# Patient Record
Sex: Female | Born: 1965 | Race: White | Hispanic: Yes | Marital: Married | Smoking: Never smoker
Health system: Southern US, Community
[De-identification: ages and names within clinical notes are randomized; demographics above are authoritative.]

## PROBLEM LIST (undated history)

## (undated) DIAGNOSIS — D649 Anemia, unspecified: Secondary | ICD-10-CM

## (undated) DIAGNOSIS — Z803 Family history of malignant neoplasm of breast: Secondary | ICD-10-CM

## (undated) DIAGNOSIS — C50919 Malignant neoplasm of unspecified site of unspecified female breast: Secondary | ICD-10-CM

## (undated) DIAGNOSIS — R232 Flushing: Secondary | ICD-10-CM

## (undated) HISTORY — DX: Anemia, unspecified: D64.9

## (undated) HISTORY — PX: OTHER SURGICAL HISTORY: SHX169

## (undated) HISTORY — DX: Flushing: R23.2

## (undated) HISTORY — DX: Family history of malignant neoplasm of breast: Z80.3

## (undated) HISTORY — PX: TUBAL LIGATION: SHX77

## (undated) HISTORY — DX: Malignant neoplasm of unspecified site of unspecified female breast: C50.919

---

## 1997-06-20 ENCOUNTER — Other Ambulatory Visit: Admission: RE | Admit: 1997-06-20 | Discharge: 1997-06-20 | Payer: Self-pay | Admitting: Obstetrics and Gynecology

## 1997-07-21 ENCOUNTER — Other Ambulatory Visit: Admission: RE | Admit: 1997-07-21 | Discharge: 1997-07-21 | Payer: Self-pay | Admitting: Gynecology

## 1997-08-14 ENCOUNTER — Encounter: Admission: RE | Admit: 1997-08-14 | Discharge: 1997-11-12 | Payer: Self-pay | Admitting: Gynecology

## 1997-12-13 ENCOUNTER — Inpatient Hospital Stay (HOSPITAL_COMMUNITY): Admission: AD | Admit: 1997-12-13 | Discharge: 1997-12-16 | Payer: Self-pay | Admitting: *Deleted

## 1998-01-29 ENCOUNTER — Other Ambulatory Visit: Admission: RE | Admit: 1998-01-29 | Discharge: 1998-01-29 | Payer: Self-pay | Admitting: Gynecology

## 1998-07-26 ENCOUNTER — Other Ambulatory Visit: Admission: RE | Admit: 1998-07-26 | Discharge: 1998-07-26 | Payer: Self-pay | Admitting: Gynecology

## 1999-05-30 ENCOUNTER — Encounter (INDEPENDENT_AMBULATORY_CARE_PROVIDER_SITE_OTHER): Payer: Self-pay

## 1999-05-31 ENCOUNTER — Inpatient Hospital Stay (HOSPITAL_COMMUNITY): Admission: AD | Admit: 1999-05-31 | Discharge: 1999-06-03 | Payer: Self-pay | Admitting: Gynecology

## 1999-06-05 ENCOUNTER — Encounter: Admission: RE | Admit: 1999-06-05 | Discharge: 1999-09-03 | Payer: Self-pay | Admitting: Gynecology

## 1999-07-25 ENCOUNTER — Other Ambulatory Visit: Admission: RE | Admit: 1999-07-25 | Discharge: 1999-07-25 | Payer: Self-pay | Admitting: Gynecology

## 2009-07-05 ENCOUNTER — Ambulatory Visit (HOSPITAL_COMMUNITY): Admission: RE | Admit: 2009-07-05 | Discharge: 2009-07-05 | Payer: Self-pay | Admitting: Orthopaedic Surgery

## 2010-05-21 LAB — CBC
HCT: 26.9 % — ABNORMAL LOW (ref 36.0–46.0)
Hemoglobin: 8.5 g/dL — ABNORMAL LOW (ref 12.0–15.0)
MCHC: 31.6 g/dL (ref 30.0–36.0)
MCV: 65.4 fL — ABNORMAL LOW (ref 78.0–100.0)
Platelets: 273 10*3/uL (ref 150–400)
RDW: 18.6 % — ABNORMAL HIGH (ref 11.5–15.5)

## 2010-07-19 NOTE — Op Note (Signed)
[facility]  Patient:    Jennifer Raymond, Jennifer Raymond                      MRN: [id number] Proc. Date: 05/31/99 Adm. Date:  [id number] Attending:  Tonye Royalty                           Operative Report  PREOPERATIVE DIAGNOSES:       1. Term intrauterine pregnancy.                               2. Labor.                               3. Lower uterine segment transverse cesarean section.                               4. Request for elective repeat cesarean section.                               5. Request elective permanent sterilization.  POSTOPERATIVE DIAGNOSES:      1. Term intrauterine pregnancy.                               2. Labor.                               3. Lower uterine segment transverse cesarean section.                               4. Request for elective repeat cesarean section.                               5. Request elective permanent sterilization.  OPERATION:                    1. Elective repeat cesarean section.                               2. Elective bilateral tubal sterilization procedure,                                  Pomeroy technique.  SURGEON:                      Juan H. Lily Peer, M.D.  ANESTHESIA:                   Spinal  INDICATION FOR PROCEDURE:     A 45 year old, Gravida 2, Para 1, 38-2/[redacted] weeks gestation with previous cesarean section requesting elective repeat and bilateral tubal sterilization procedure.  The patient in labor.  Cervix 5 cm dilated. Completely effaced with minus 1 station.  FINDINGS:                     Female infant, Apgars of 9 and  9 with a weight of 7 pounds 11 ounces. Arterial cord pH of 7.26.  Pelvic adhesions.  Anterior uterine fibroid measuring 2 x 2 cm.  Bladder adhered high in the anterior uterine wall.  Normal tubes and ovaries.  DESCRIPTION OF PROCEDURE:     After the patient was adequately counselled, she as taken to the operating room and she underwent a  successful spinal placement.  A  Pfannenstiel skin incision was made adjacent to previous Pfannenstiel scar. The incision was carried down from the skin, subcutaneous tissue down to the rectus  fascia whereby a midline nick was made. The fascia was incised in a transverse fashion.  The midline raphe was entered cautiously.  The peritoneum was entered  cautiously.  The bladder flap was meticulously developed after freeing it from ts adherence high into the anterior wall along with omental adhesions. Once this was freed and brought down, the lower uterine segment was identified and a transverse incision was made. Clear amniotic fluid was present.  The infant was delivered.  The nasopharynx was bulbed suctioned.  The newborn was delivered.  The cord was  doubly clamped and excised.  The newborn gave immediate cry.  The female infant was shown to the parents and passed off to the pediatricians who were in attendance who gave the above mentioned parameters.  After cord blood was obtained, the placenta was delivered from the intrauterine cavity and the uterus was exteriorized. The uterine cavity was swept clean of remaining products of conception.  The lower uterine segment was closed in a locking stitch manner with 0 Vicryl suture. Due to the meticulous dissection and separation of the bladder and omentum from the anterior uterine wall. The serosa appears somewhat denuded and several figure-of-eight sutures with 3-0 Vicryl suture was required for additional hemostasis after the uterus was placed back into the abdominal cavity. Surgicel  strip was placed in the interior uterine wall as well as the Space of Retzius which had some mild oozing.  Prior to that it was contained but for additional hemostasis the Surgicel was placed.  After this, the visceroperitoneum was now reapproximated and the rectus fascia was approximated with a running stitch of 0 Vicryl suture. The subcutaneous  bleeders Bovie cauterized and the skin was reapproximated with  skin clips followed by placement of Xeroform gauze and followed by a dressing. The patient was transferred to recovery room with stable vital signs. Blood loss from procedure was 1000 cc. IV fluid was 4000 cc of lactated Ringers. Urine output was 400 cc.  Of note, en route from the emergency room to the operating room due to the fact that the patient had a history of positive Group B Strep culture and was in labor and 4 to 5 cm dilated, she was given 3 gram of Unasyn IV. DD:  05/31/99 TD:  05/31/99 Job: [id number]

## 2010-07-19 NOTE — H&P (Signed)
[facility]  Patient:    Jennifer Raymond, Jennifer Raymond                      MRN: [id number] Adm. Date:  [id number] Attending:  Tonye Royalty                         History and Physical  CHIEF COMPLAINT:              1. Previous cesarean section; request for elective                                  repeat.                               2. Active labor.  HISTORY:                      The patient is a 45 year old gravida 2, para 1 with a last menstrual period July 5th, estimated date of confinement of June 12, 1999, currently 38-2/[redacted] weeks gestation, who presented to [facility] complaining of contractions starting at approximately 1600 hours yesterday. When she was placed on the monitor, she was found to be contracting every two to three minutes apart and her pelvic examination demonstrated her cervix to be 5-cm dilated, complete effacement and -1 station.  Patient denied any rupture of membranes and there was no gross amniotic fluid noted on the pad or on vaginal examination.  Patient had had a cesarean section for failure to progress in labor with her previous pregnancy and decided to proceed with an elective repeat, despite the fact that she was counseled, and explained to her the ACOG guidelines and that she would be a candidate for trial of labor; nevertheless, she opted to proceed  with a repeat cesarean section.  Also, she had requested for immediate postpartum tubal sterilization procedure.  The risks, benefits, pros and cons of the procedure were discussed prenatally as well.  Patients prenatal course essentially had otherwise been unremarkable.  ALLERGIES:                    Patient denies any allergy.  PAST MEDICAL HISTORY:         Previous pregnancy in 1999 resulted in cesarean section secondary to failure to progress.  She did have gestational diabetes with that pregnancy but had normal glucose screen in this  pregnancy.  No other medical problems reported.  PHYSICAL EXAMINATION:  VITAL SIGNS:                  Temperature 99.2, respirations 18, blood pressure  138/76.  HEENT:                        Unremarkable.  NECK:                         Supple.  Trachea midline.  No carotid bruits.  No  thyromegaly.  LUNGS:                        Clear to auscultation, without rhonchi or wheezes.  HEART:  Regular rate and rhythm.  No murmurs or gallops.  BREASTS:                      Examination done during the first trimester was reported to be normal.  ABDOMEN:                      Gravid uterus, vertex presentation by Thayer Ohm maneuver.  Positive fetal heart tones.  PELVIC:                       Cervix 4- to 5-cm dilated, complete effacement, -1 station.  EXTREMITIES:                  DTRs 1+.  Negative clonus.  Trace edema.  PRENATAL LABORATORY DATA:     O-positive blood type.  Negative antibody screen.  VDRL, hepatitis B surface antigen and HIV were negative.  Rubella titer with evidence of immunity.  Pap smear was normal.  Alpha-fetoprotein was normal. Diabetes screen was normal and group B strep culture was positive.  ASSESSMENT:                   [age]-year-old gravida 2, para 1, with previous cesarean section, in active labor with advanced cervical dilatation and 5-cm dilated, at 38-2/[redacted] weeks gestation, with a previous cesarean section, requesting elective repeat as well as elective permanent sterilization.  The risks, benefits, pros and cons, failure rate, potential complications from repeat cesarean section were discussed, all questions were answered and will follow accordingly.  Overall fetal heart rate tracing was reassuring, occasional variables were noted and vital signs were stable.  PLAN:                         Repeat cesarean section with bilateral tubal sterilization procedure. DD:  05/31/99 TD:  05/31/99 Job: [id number]

## 2010-07-19 NOTE — Discharge Summary (Signed)
[facility]  Patient:    Jennifer Raymond, Jennifer Raymond                      MRN: [id number] Adm. Date:  [id number] Disc. Date: [id number] Attending:  Tonye Royalty Dictator:   Antony Contras, RNC, [facility]                           Discharge Summary  DISCHARGE DIAGNOSES:          1. Intrauterine pregnancy at term.                               2. History of previous cesarean section, desires                                  elective repeat.                               3. Delivery of a viable infant.  PROCEDURE:                    Elective repeat cesarean section, elective bilateral tubal sterilization Pomeroy technique.  HISTORY OF PRESENT ILLNESS:   The patient is a 45 year old, gravida 2, para 1-0-0-1, with an LMP September 05, 1998, [facility] June 12, 1999.  The patient did have gestational diabetes with a previous pregnancy, but glucose screen with this pregnancy was within normal limits.  PRENATAL LABORATORY DATA:     Blood type O positive, antibody screen negative, rubella immune.  RPR, HBSAG, HIV negative.  MSAFP within normal limits.  GBS positive.  HOSPITAL COURSE:              The patient presented on May 31, 1999, in labor. Cervical examination was 5 cm, completely effaced, and -1 station.  The patient  desired repeat cesarean section with this pregnancy and sterilization, so a low  transverse cesarean section with bilateral tubal sterilization was carried out under spinal anesthesia by Gaetano Hawthorne. Lily Peer, M.D.  The patient was delivered of an Apgars 9 and 84 female infant.  Weight was 7 pounds 11 ounces.  Cord pH was 7.26. Pelvic findings included some pelvic adhesions.  Anterior uterine fibroids 2.2 m. Postoperative course was uncomplicated.  The patient remained afebrile and voiding without difficulty.  She was able to be discharged on her third postoperative day in satisfactory condition.  Postoperative CBC; hematocrit 32.1,  hemoglobin 11.2, platelets 94, WBC 13.1.  DISPOSITION:                  Follow up at [facility] in six weeks.  Continue with prenatal vitamins and iron, Motrin and Tylox for pain. DD:  07/08/99 TD:  07/08/99 Job: [id number]

## 2013-10-11 ENCOUNTER — Other Ambulatory Visit (HOSPITAL_COMMUNITY): Payer: Self-pay | Admitting: Physician Assistant

## 2013-10-11 DIAGNOSIS — Z1231 Encounter for screening mammogram for malignant neoplasm of breast: Secondary | ICD-10-CM

## 2013-10-21 ENCOUNTER — Ambulatory Visit (HOSPITAL_COMMUNITY)
Admission: RE | Admit: 2013-10-21 | Discharge: 2013-10-21 | Disposition: A | Discharge status: Home / Self Care | Payer: Self-pay | Source: Ambulatory Visit | Attending: Physician Assistant | Admitting: Physician Assistant

## 2013-10-21 DIAGNOSIS — Z1231 Encounter for screening mammogram for malignant neoplasm of breast: Secondary | ICD-10-CM

## 2013-11-03 ENCOUNTER — Other Ambulatory Visit (HOSPITAL_COMMUNITY): Payer: Self-pay | Admitting: *Deleted

## 2013-11-03 DIAGNOSIS — N6310 Unspecified lump in the right breast, unspecified quadrant: Secondary | ICD-10-CM

## 2013-11-03 DIAGNOSIS — N6452 Nipple discharge: Secondary | ICD-10-CM

## 2013-11-15 ENCOUNTER — Other Ambulatory Visit (HOSPITAL_COMMUNITY): Payer: Self-pay | Admitting: Obstetrics and Gynecology

## 2013-11-15 ENCOUNTER — Ambulatory Visit
Admission: RE | Admit: 2013-11-15 | Discharge: 2013-11-15 | Disposition: A | Discharge status: Home / Self Care | Payer: No Typology Code available for payment source | Source: Ambulatory Visit | Attending: Obstetrics and Gynecology | Admitting: Obstetrics and Gynecology

## 2013-11-15 ENCOUNTER — Ambulatory Visit (HOSPITAL_COMMUNITY)
Admission: RE | Admit: 2013-11-15 | Discharge: 2013-11-15 | Disposition: A | Discharge status: Home / Self Care | Payer: No Typology Code available for payment source | Source: Ambulatory Visit | Attending: Obstetrics and Gynecology | Admitting: Obstetrics and Gynecology

## 2013-11-15 ENCOUNTER — Encounter (HOSPITAL_COMMUNITY): Payer: Self-pay

## 2013-11-15 VITALS — BP 120/78 | Temp 99.6°F | Ht 59.0 in | Wt 185.4 lb

## 2013-11-15 DIAGNOSIS — N6310 Unspecified lump in the right breast, unspecified quadrant: Secondary | ICD-10-CM

## 2013-11-15 DIAGNOSIS — N6452 Nipple discharge: Secondary | ICD-10-CM

## 2013-11-15 DIAGNOSIS — Z1239 Encounter for other screening for malignant neoplasm of breast: Secondary | ICD-10-CM

## 2013-11-15 DIAGNOSIS — N6311 Unspecified lump in the right breast, upper outer quadrant: Secondary | ICD-10-CM | POA: Insufficient documentation

## 2013-11-15 NOTE — Progress Notes (Signed)
Complaints of right breast lump x 5 months. Complaints of right breast greenish colored discharge that happens spontaneously during her shower x 3-4 months.  Pap Smear:   Pap smear not completed today. Last Pap smear was in March 2014 at one of the free cervical cancer screenings offered by [facility] [address] and normal per patient. Per patient has no history of an abnormal Pap smear. No Pap smear results in EPIC.  Physical exam: Breasts Breasts symmetrical. No skin abnormalities left breast. Right nipple reddened and irritated in appearance. No nipple retraction bilateral breasts. No nipple discharge bilateral breasts. Unable to express nipple discharge from patient's right nipple.  No lymphadenopathy. No lumps palpated left breast. Palpated a lump within the right breast under the areola at 10 o'clock. Complaints of tenderness when palpated lump within the right breast.  Referred patient to the [facility] for diagnostic mammogram and possible right breast ultrasound. Appointment scheduled for Tuesday, November 15, 2013 at 1000.      Pelvic/Bimanual No Pap smear completed today since last Pap smear was in March 2014. Pap smear not indicated per BCCCP guidelines.

## 2013-11-15 NOTE — Patient Instructions (Signed)
Explained to  Jennifer Raymond that she did not need a Pap smear today due to last Pap smear was in March 2014 per patient. Let her know BCCCP will cover Pap smears every 3 years unless has a history of abnormal Pap smears. Referred patient to the [facility] for diagnostic mammogram and possible right breast ultrasound. Appointment scheduled for Tuesday, November 15, 2013 at 1000. Patient aware of appointment and will be there.  Jennifer Raymond verbalized understanding.  Kevyn Wengert, Arvil Chaco, RN 9:02 AM

## 2013-11-16 ENCOUNTER — Other Ambulatory Visit (INDEPENDENT_AMBULATORY_CARE_PROVIDER_SITE_OTHER): Payer: Self-pay | Admitting: Surgery

## 2013-11-16 DIAGNOSIS — C50911 Malignant neoplasm of unspecified site of right female breast: Secondary | ICD-10-CM

## 2013-11-17 ENCOUNTER — Telehealth: Payer: Self-pay | Admitting: *Deleted

## 2013-11-17 DIAGNOSIS — C50411 Malignant neoplasm of upper-outer quadrant of right female breast: Secondary | ICD-10-CM | POA: Insufficient documentation

## 2013-11-17 NOTE — Telephone Encounter (Signed)
Confirmed [facility] for 11/23/13 at 0800.  Instructions and contact information given. With assistance from [place] ([place] interpreter).

## 2013-11-22 ENCOUNTER — Ambulatory Visit
Admission: RE | Admit: 2013-11-22 | Discharge: 2013-11-22 | Disposition: A | Discharge status: Home / Self Care | Payer: No Typology Code available for payment source | Source: Ambulatory Visit | Attending: Surgery | Admitting: Surgery

## 2013-11-22 DIAGNOSIS — C50911 Malignant neoplasm of unspecified site of right female breast: Secondary | ICD-10-CM

## 2013-11-22 MED ORDER — GADOBENATE DIMEGLUMINE 529 MG/ML IV SOLN
17.0000 mL | Freq: Once | INTRAVENOUS | Status: AC | PRN
  Administered 2013-11-22: 17 mL via INTRAVENOUS

## 2013-11-23 ENCOUNTER — Ambulatory Visit: Payer: No Typology Code available for payment source | Attending: Physical Therapy | Admitting: Physical Therapy

## 2013-11-23 ENCOUNTER — Telehealth: Payer: Self-pay | Admitting: Hematology and Oncology

## 2013-11-23 ENCOUNTER — Other Ambulatory Visit (HOSPITAL_BASED_OUTPATIENT_CLINIC_OR_DEPARTMENT_OTHER): Payer: Self-pay

## 2013-11-23 ENCOUNTER — Encounter: Payer: Self-pay | Admitting: Dietician

## 2013-11-23 ENCOUNTER — Ambulatory Visit (HOSPITAL_BASED_OUTPATIENT_CLINIC_OR_DEPARTMENT_OTHER): Payer: Self-pay | Admitting: Hematology and Oncology

## 2013-11-23 ENCOUNTER — Ambulatory Visit
Admission: RE | Admit: 2013-11-23 | Discharge: 2013-11-23 | Disposition: A | Discharge status: Home / Self Care | Payer: Self-pay | Source: Ambulatory Visit | Attending: Radiation Oncology | Admitting: Radiation Oncology

## 2013-11-23 ENCOUNTER — Encounter (INDEPENDENT_AMBULATORY_CARE_PROVIDER_SITE_OTHER): Payer: Self-pay

## 2013-11-23 ENCOUNTER — Ambulatory Visit: Payer: No Typology Code available for payment source

## 2013-11-23 ENCOUNTER — Encounter: Payer: Self-pay | Admitting: Hematology and Oncology

## 2013-11-23 ENCOUNTER — Ambulatory Visit (INDEPENDENT_AMBULATORY_CARE_PROVIDER_SITE_OTHER): Payer: Self-pay | Admitting: Surgery

## 2013-11-23 ENCOUNTER — Other Ambulatory Visit: Payer: Self-pay

## 2013-11-23 VITALS — BP 124/56 | HR 82 | Temp 99.0°F | Resp 18 | Wt 179.6 lb

## 2013-11-23 DIAGNOSIS — IMO0001 Reserved for inherently not codable concepts without codable children: Secondary | ICD-10-CM | POA: Insufficient documentation

## 2013-11-23 DIAGNOSIS — C50419 Malignant neoplasm of upper-outer quadrant of unspecified female breast: Secondary | ICD-10-CM

## 2013-11-23 DIAGNOSIS — D509 Iron deficiency anemia, unspecified: Secondary | ICD-10-CM

## 2013-11-23 DIAGNOSIS — R293 Abnormal posture: Secondary | ICD-10-CM | POA: Insufficient documentation

## 2013-11-23 DIAGNOSIS — Z171 Estrogen receptor negative status [ER-]: Secondary | ICD-10-CM

## 2013-11-23 DIAGNOSIS — C50411 Malignant neoplasm of upper-outer quadrant of right female breast: Secondary | ICD-10-CM

## 2013-11-23 DIAGNOSIS — Z8781 Personal history of (healed) traumatic fracture: Secondary | ICD-10-CM | POA: Insufficient documentation

## 2013-11-23 DIAGNOSIS — C50919 Malignant neoplasm of unspecified site of unspecified female breast: Secondary | ICD-10-CM | POA: Insufficient documentation

## 2013-11-23 LAB — COMPREHENSIVE METABOLIC PANEL (CC13)
ALT: 31 U/L (ref 0–55)
ANION GAP: 12 meq/L — AB (ref 3–11)
AST: 22 U/L (ref 5–34)
Albumin: 4 g/dL (ref 3.5–5.0)
Alkaline Phosphatase: 99 U/L (ref 40–150)
BILIRUBIN TOTAL: 0.66 mg/dL (ref 0.20–1.20)
BUN: 7.3 mg/dL (ref 7.0–26.0)
CO2: 24 meq/L (ref 22–29)
Calcium: 9.4 mg/dL (ref 8.4–10.4)
Chloride: 104 mEq/L (ref 98–109)
Creatinine: 0.8 mg/dL (ref 0.6–1.1)
GLUCOSE: 137 mg/dL (ref 70–140)
Potassium: 3.9 mEq/L (ref 3.5–5.1)
SODIUM: 140 meq/L (ref 136–145)
TOTAL PROTEIN: 7.8 g/dL (ref 6.4–8.3)

## 2013-11-23 LAB — CBC WITH DIFFERENTIAL/PLATELET
BASO%: 0.7 % (ref 0.0–2.0)
BASOS ABS: 0 10*3/uL (ref 0.0–0.1)
EOS ABS: 0.1 10*3/uL (ref 0.0–0.5)
EOS%: 1.3 % (ref 0.0–7.0)
HEMATOCRIT: 24.4 % — AB (ref 34.8–46.6)
LYMPH%: 45 % (ref 14.0–49.7)
MCH: 16.1 pg — ABNORMAL LOW (ref 25.1–34.0)
MCHC: 27.4 g/dL — ABNORMAL LOW (ref 31.5–36.0)
MCV: 58.6 fL — AB (ref 79.5–101.0)
MONO#: 0.5 10*3/uL (ref 0.1–0.9)
MONO%: 11.9 % (ref 0.0–14.0)
NEUT%: 41.1 % (ref 38.4–76.8)
NEUTROS ABS: 1.6 10*3/uL (ref 1.5–6.5)
PLATELETS: 273 10*3/uL (ref 145–400)
RBC: 4.17 10*6/uL (ref 3.70–5.45)
RDW: 20 % — ABNORMAL HIGH (ref 11.2–14.5)
WBC: 3.8 10*3/uL — ABNORMAL LOW (ref 3.9–10.3)
lymph#: 1.7 10*3/uL (ref 0.9–3.3)

## 2013-11-23 LAB — IRON AND TIBC CHCC
%SAT: 3 % — AB (ref 21–57)
IRON: 12 ug/dL — AB (ref 41–142)
TIBC: 390 ug/dL (ref 236–444)
UIBC: 379 ug/dL (ref 120–384)

## 2013-11-23 LAB — FERRITIN CHCC: Ferritin: <4 ng/ml — ABNORMAL LOW (ref 9–269)

## 2013-11-23 NOTE — Assessment & Plan Note (Addendum)
Patient has a hemoglobin of 6.7 with an MCV of 58 suggestive of iron deficiency anemia. Patient will need to be evaluated with iron studies and upper and lower endoscopies as well as GYN examination. Because she is asymptomatic, I would like to administer intravenous iron therapy to improve her hemoglobin.

## 2013-11-23 NOTE — Addendum Note (Signed)
Addended by: Rockwell Germany on: 11/23/2013 11:39 AM   Modules accepted: Orders

## 2013-11-23 NOTE — Progress Notes (Addendum)
Jennifer Raymond CONSULT NOTE  Patient Care Team: Garnette Czech, MD as PCP - General (Geriatric Medicine)  CHIEF COMPLAINTS/PURPOSE OF CONSULTATION:  Newly diagnosed breast cancer  HISTORY OF PRESENTING ILLNESS:  Jennifer Raymond 48 y.o. Hispanic female is here because of recent diagnosis of right-sided breast cancer. Patient noticed a lump in the right breast about 4 months ago which was slowly increasing in size and finally she underwent a mammogram on 11/15/2013 that revealed a right breast mass at 10:00 position 1 cm from the nipple with excoriation of the nipple this was followed by an ultrasound which revealed a 1.8 x 1.4 x 1.0 cm mass in the right breast in addition to that there was a normal-appearing 6 mm right axillary lymph node. She underwent an MRI of the breasts and 9 point 2015 that showed an irregular enhancing mass in the upper-outer quadrant of the right breast measuring 3.1 x 1.6 x 2.5 cm it was contiguous with the nipple. There was a level I right axillary lymph node 2.9 cm it maintains a fatty hilum and the cortex was prominent ultrasound that location was negative it was not biopsied. Clinically she is a T2, N0, M0 stage II A. 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 She was presented at the multidisciplinary breast tumor board this morning and she is here today in the clinic to talk about treatment plan. Patient was also incidentally found to have a hemoglobin of 6.7. Upon further questioning she reports that she does feel occasionally dizzy and a very occasional palpitations as well. But she denies having seen any black-colored stool or blood in the stool. She denies having any heavy menstrual periods. We used a Spanish interpreter to converse with her.   I reviewed her records extensively and collaborated the history with the patient.  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  She was never exposed to fertility medications or hormone replacement therapy.  She has  family history of Breast/GYN/GI cancer; her maternal grandmother in the 47s was diagnosed with breast cancer and  MEDICAL HISTORY:  No past medical history on file.  SURGICAL HISTORY: Past Surgical History  Procedure Laterality Date  . Cesarean section      2 previous  . Wrist surgery due to fracture    . Tubal ligation      SOCIAL HISTORY: History   Social History  . Marital Status: Married    Spouse Name: N/A    Number of Children: N/A  . Years of Education: N/A   Occupational History  . Not on file.   Social  History Main Topics  . Smoking status: Never Smoker   . Smokeless tobacco: Never Used  . Alcohol Use: No  . Drug Use: No  . Sexual Activity: Yes    Birth Control/ Protection: Surgical   Other Topics Concern  . Not on file   Social History Narrative  . No narrative on file    FAMILY HISTORY: Family History  Problem Relation Age of Onset  . Diabetes Mother   . Hypertension Mother   . Breast cancer Maternal Grandmother     ALLERGIES:  has No Known Allergies.  MEDICATIONS:  No current outpatient prescriptions on file.   No  current facility-administered medications for this visit.    REVIEW OF SYSTEMS:   Constitutional: Denies fevers, chills or abnormal night sweats Eyes: Denies blurriness of vision, double vision or watery eyes Ears, nose, mouth, throat, and face: Denies mucositis or sore throat Respiratory: Denies cough, dyspnea or wheezes Cardiovascular: Complains of palpitation, but no chest discomfort or lower extremity swelling Gastrointestinal:  Denies nausea, heartburn or change in bowel habits Skin: Denies abnormal skin rashes Lymphatics: Denies new lymphadenopathy or easy bruising Neurological:Denies numbness, tingling or new weaknesses Behavioral/Psych: Mood is stable, no new changes  Breast: Palpable lump in the right breast All other systems were reviewed with the patient and are negative.  PHYSICAL EXAMINATION: ECOG PERFORMANCE STATUS: 0 - Asymptomatic  Filed Vitals:   11/23/13 0913  BP: 124/56  Pulse: 82  Temp: 99 F (37.2 C)  Resp: 18   Filed Weights   11/23/13 0913  Weight: 179 lb 9.6 oz (81.466 kg)    GENERAL:alert, no distress and comfortable SKIN: skin color, texture, turgor are normal, no rashes or significant lesions EYES: normal, conjunctiva are pink and non-injected, sclera clear OROPHARYNX:no exudate, no erythema and lips, buccal mucosa, and tongue normal  NECK: supple, thyroid normal size, non-tender, without nodularity LYMPH:  no palpable lymphadenopathy in the cervical, axillary or inguinal LUNGS: clear to auscultation and percussion with normal breathing effort HEART: regular rate & rhythm and no murmurs and no lower extremity edema ABDOMEN:abdomen soft, non-tender and normal bowel sounds Musculoskeletal:no cyanosis of digits and no clubbing  PSYCH: alert & oriented x 3 with fluent speech NEURO: no focal motor/sensory deficits BREAST: Right breast mass is palpable underneath the nipple with some nipple changes is at least 4 cm a physical exam. No palpable  axillary or supraclavicular lymphadenopathy  LABORATORY DATA:  I have reviewed the data as listed Lab Results  Component Value Date   WBC 3.8* 11/23/2013   HGB 6.7 Repeated and Verified* 11/23/2013   HCT 24.4* 11/23/2013   MCV 58.6* 11/23/2013   PLT 273 11/23/2013   Lab Results  Component Value Date   NA 140 11/23/2013   K 3.9 11/23/2013   CO2 24 11/23/2013    RADIOGRAPHIC STUDIES: I have personally reviewed the radiological reports and agreed with the findings in the report.  ASSESSMENT AND PLAN:  Breast cancer of upper-outer quadrant of right female breast 1. 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 clinical staging ER PR 0% HER-2 negative ratio 1.3 Ki-67 77%  2. Discussed with the patient, the details of pathology including the type of breast cancer,the clinical staging, the significance of ER, PR and HER-2/neu receptors and the implications for treatment. After reviewing the pathology in detail, we proceeded to discuss the different treatment options between surgery, radiation, chemotherapy, because she is ER/PR negative she does not need antiestrogen  therapy.  3. Because she is triple negative, she will need chemotherapy. I briefly discussed with her standard chemotherapy regimen with dose dense Adriamycin and Cytoxan x4 cycles followed by weekly Taxol carboplatin x12 weeks. If she chooses breast conservation, she can be treated with neoadjuvant chemotherapy. If she wants mastectomy, then adjuvant chemotherapy could be an option.  4. Chemo Info: I have discussed the risks and benefits of chemotherapy including the risks of nausea/ vomiting, risk of infection from low WBC count, fatigue due to chemo or anemia, bruising or bleeding due to low platelets, mouth sores, loss/ change in taste and decreased appetite. Liver and kidney function will be monitored through out chemotherapy as abnormalities in liver and kidney function may be a side effect of  treatment. Cardiac dysfunction due to Adriamycin was discussed in detail. Risk of permanent bone marrow dysfunction and leukemia due to chemo were also discussed.  5. Patient will need genetic counseling. This may enable her to make a decision whether or not to undergo mastectomy.  6. In anticipation of requiring chemotherapy, patient would require a port placement, chemotherapy education, echocardiogram.  Iron deficiency anemia, unspecified Patient has a hemoglobin of 6.7 with an MCV of 58 suggestive of iron deficiency anemia. Patient will need to be evaluated with iron studies and upper and lower endoscopies as well as GYN examination. Because she is asymptomatic, I would like to administer intravenous iron therapy to improve her hemoglobin.   All questions were answered. The patient knows to call the clinic with any problems, questions or concerns. I spent 55 minutes counseling the patient face to face. The total time spent in the appointment was 60 minutes and more than 50% was on counseling.     Rulon Eisenmenger, MD 11/23/2013 10:20 AM

## 2013-11-23 NOTE — H&P (Signed)
Re:   Jennifer Raymond DOB:   1965/10/04 MRN:   [id number]  Breast MDC  ASSESSMENT AND PLAN: 1.  Right breast cancer, 10 o'clock and involves nipple   [photo at end of chart]  Biopsy - 11/15/2013 ([id number]) - IDC, ER - 0%, PR - 0%, Ki67 - 77%, Her2Neu - Neg  I discussed the options for breast cancer treatment with the patient.  She is in the multidisciplinary clinic and understands treatment of breast cancer includes medical oncology and radiation oncology.  I discussed the surgical options of lumpectomy vs. mastectomy.  If mastectomy, there is the possibility of reconstruction.   I discussed the options of lymph node biopsy.  The treatment plan depends on the pathologic staging of the tumor and the patient's personal wishes.  The risks of surgery include, but are not limited to, bleeding, infection, the need for further surgery, and nerve injury.  The patient has been given literature on the treatment of breast cancer.  Discussed:  1)  EGD and colonoscopy, 2) power port placement (the pateint was against this, but Dr. Lindi Adie and I think it will help her chemotx treatments), 3) Probable neoadjuvant chemo, but we could do surgery first.  I lean towards chemotx first, 4)  Lumpectomy vs mastectomy with SLNBx - at this time she is undecided.  Whether we would do immediate reconstruction depends on he choices.  I would say no to now, but if chemo shrinks to tumor, maybe later.  There was some sense that the patient wants nothing done and is overwhelmed by the findings and options.  I will need to speak to her again.  2.  Anemia  Hgb - 6.7  Needs work up.  Most likely due to heavy periods.  Will plan upper endo and colonoscopy.  I discussed the indications and complications of this with the patient.  I gave her a prescription for Moviprep.  The interpreter had had a recent colonoscopy.  3.  Umbilical hernia  4.  No PCP - needs medical follow up 5.  Will need translator.  No chief complaint  on file.  REFERRING PHYSICIAN: HOOPER,JEFFREY C, MD  HISTORY OF PRESENT ILLNESS: Jennifer Raymond is a 48 y.o. (DOB: 1965/11/03)  Hispanic  female whose primary care physician is HOOPER,JEFFREY C, MD and comes to me today for new right breast cancer. Almyra Free acted as Astronomer. Her husband is with her.  He actually I thinks speaks [place] well.  She has limited [place].  She went for her first mammogram.  She had noticed a right nipple change for several months.  She was referred through [facility].  She has a grandmother (mother's side) who had breast cancer.  No history of colon ca.  She is still having regular periods.  They are somewhat heavy, but no change over the last 5 years.  Her mammogram at [place] - 11/15/2013 - Highly suspicious right breast mass 10 o'clock location - measuring overall 1.8 x 1.4 x 1.2 cm. MRI of breast - 11/23/2103 - There is an irregular enhancing mass in the anterior third of the upper-outer quadrant of the right breast measuring 3.1 x 1.6 x 2.5 cm in anterior posterior, transverse and craniocaudal  dimensions. The mass is directly contiguous with the nipple which enhances.    Past Medical History  Diagnosis Date  . Anemia   . Hot flashes       Past Surgical History  Procedure Laterality Date  . Cesarean section  2 previous  . Wrist surgery due to fracture    . Tubal ligation        No current outpatient prescriptions on file.   No current facility-administered medications for this visit.     No Known Allergies  REVIEW OF SYSTEMS: Skin:  No history of rash.  No history of abnormal moles. Infection:  No history of hepatitis or HIV.  No history of MRSA. Neurologic:  No history of stroke.  No history of seizure.  No history of headaches. Cardiac:  No history of hypertension. No history of heart disease.  No history of prior cardiac catheterization.  No history of seeing a cardiologist. Pulmonary:  Does not smoke cigarettes.  No asthma or  bronchitis.  No OSA/CPAP.  Endocrine:  No diabetes. No thyroid disease. Gastrointestinal:  No history of stomach disease.  No history of liver disease.  No history of gall bladder disease.  No history of pancreas disease.  No history of colon disease. Urologic:  No history of kidney stones.  No history of bladder infections. GYN:  C section x 2.  There are in the notes that she had a pap smear i 2004. Musculoskeletal:  No history of joint or back disease. Hematologic:  No bleeding disorder.  No history of anemia.  Not anticoagulated. Psycho-social:  The patient is oriented.   The patient has no obvious psychologic or social impairment to understanding our conversation and plan.  SOCIAL and FAMILY HISTORY: Married.  Her husband is with her. She has two children:  41 boy and 14 girl  PHYSICAL EXAM: LMP 11/05/2013  General: WN Hispanic female who is alert and generally healthy appearing.  HEENT: Normal. Pupils equal. Neck: Supple. No mass.  No thyroid mass. Lymph Nodes:  No supraclavicular or cervical nodes. Lungs: Clear to auscultation and symmetric breath sounds. Heart:  RRR. No murmur or rub. Breast:  Right - 1.0 cm change of nipple.  Mass effect under nipple towards upper outer quadrant about 3.0 cm  Left - no mass or change in nipple  Abdomen: Soft. No mass. No tenderness. Umbilical hernia.   Normal bowel sounds.   Rectal: Somewhat tight.  No mass.  Guaiac neg. Extremities:  Good strength and ROM  in upper and lower extremities. Neurologic:  Grossly intact to motor and sensory function. Psychiatric: Has normal mood and affect. Behavior is normal.    Right breast.  Note 1.0 cm ulcer of lateral nipple.   DATA REVIEWED: Epic notes.  Alphonsa Overall, MD,  [facility] Surgery, [place]  [place], [place]    [place] Phone:  [phone] FAX:  [phone]

## 2013-11-23 NOTE — Progress Notes (Signed)
Checked in new pt with no insurance.  Pt is approved for 100% discount thru the hospital that will exp 04/22/14.  Pt is also in [place].  I informed pt of the [employer] and gave flyer on what they assist with.  I gave her Raquel's card if she wants to apply for the grant as well as for any questions or concerns.

## 2013-11-23 NOTE — Progress Notes (Signed)
Note created by Dr. Gudena during office visit. Copy to patient, original to scan. 

## 2013-11-23 NOTE — Assessment & Plan Note (Addendum)
1. 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 clinical staging ER PR 0% HER-2 negative ratio 1.3 Ki-67 77%  2. Discussed with the patient, the details of pathology including the type of breast cancer,the clinical staging, the significance of ER, PR and HER-2/neu receptors and the implications for treatment. After reviewing the pathology in detail, we proceeded to discuss the different treatment options between surgery, radiation, chemotherapy, antiestrogen therapies.  3. Because she is triple negative, she will need chemotherapy. I briefly discussed with her standard chemotherapy regimen with dose dense Adriamycin and Cytoxan x4 cycles followed by weekly Taxol carboplatin x12 weeks. If she chooses breast conservation, she can be treated with neoadjuvant chemotherapy. If she wants mastectomy, then adjuvant chemotherapy could be an option.  4. Chemo Info: I have discussed the risks and benefits of chemotherapy including the risks of nausea/ vomiting, risk of infection from low WBC count, fatigue due to chemo or anemia, bruising or bleeding due to low platelets, mouth sores, loss/ change in taste and decreased appetite. Liver and kidney function will be monitored through out chemotherapy as abnormalities in liver and kidney function may be a side effect of treatment. Cardiac dysfunction due to Adriamycin was discussed in detail. Risk of permanent bone marrow dysfunction and leukemia due to chemo were also discussed.  5. Patient will need genetic counseling. This may enable her to make a decision whether or not to undergo mastectomy.  6. In anticipation of requiring chemotherapy, patient would require a port placement, chemotherapy education, echocardiogram. 

## 2013-11-23 NOTE — Progress Notes (Unsigned)
Patient was seen by RD during [facility] on 11/23/2013  Provided pt with folder of educational materials regarding general nutrition recommendations for breast cancer patients, plant-based diets, antioxidants, cancer facts vs myths, and information on organic foods  Explained importance of healthy nutrition during treatments and encouraged pt to consume daily recommended amount of fruits and vegetables, emphasizing variety of intake for maximum antioxidant and synergistic health benefits. Promoted adequate fiber intake, with use of whole grain and whole wheat products, beans, and lentils. Encouraged patient to follow a low fat diet with use of heart healthy fats, and to opt for plant-based proteins weekly  Recommended pt maintain healthy weight during treatments, and encouraged gradual weight loss as warranted after procedures.  Diet recall indicated pt has recently changed her diet in past one week. Prior to diet change, pt was consuming a lot of bread, corn tortilla, chicken, and minimal fruit and vegetable intake. Pt adjusted diet to decrease bread and tortilla intake, increase intake of fruits and vegetables, and has also incorporated fish into diet. With assistance from interpreter, reviewed general nutrition recommendations as stated above. Utilized Marine scientist education booklets.   Patient had questions regarding types of fruits and vegetables to incorporate. Encouraged variety for optimal antioxidant and phytochemical intake  Expect good compliance. Pt had already started making appropriate diet changes prior to education, and appeared to be eager and confident in continuing to comply with recommendations.  Provided pt with outpatient oncology RD contact information. Encouraged pt to contact RD with additional follow up questions or nutrition-related concerns.  Atlee Abide MS RD LDN Clinical Dietitian [phone]

## 2013-11-23 NOTE — Telephone Encounter (Signed)
, °

## 2013-11-23 NOTE — Progress Notes (Signed)
Radiation Oncology         [phone]) [phone] ________________________________  Initial outpatient Consultation - Date: 11/23/2013   Name: Jennifer Raymond MRN: [id number]   DOB: 1965-09-17  REFERRING PHYSICIAN: Alphonsa Overall, MD  DIAGNOSIS: T2N0 Right breast cancer  HISTORY OF PRESENT ILLNESS::Jennifer Raymond is a 48 y.o. female  With a palpable right breast mass with nipple discharge. A 1.8 x 1.4 x 1.2 cm mass was seen on ultrasound. A biopsy was performed that showed a Grade 3 IDC +DCIS. This was triple negative.  A biopsy showed Grade 3 Invasive Ductal Carcinoma which was triple negative with a Ki-77%. MRI showed 3.1 x 1.6 x 2.5 cm contiguous enhancement involving the nipple. A lymph node with mildly prominent cortical thickening was noted and correlation with sentinel lymph node biopsy was recommended. She is accompanied by her husband and an interpreter. She had menses at 11 and her last period was 9/5. She is having regular periods. She is GxP2 with her first birth at 57. She has recovered well from her biopsy. She has chosen neoadjuvant chemotherapy.   PREVIOUS RADIATION THERAPY: No  PAST MEDICAL HISTORY:  has no past medical history on file.    PAST SURGICAL HISTORY: Past Surgical History  Procedure Laterality Date  . Cesarean section      2 previous  . Wrist surgery due to fracture    . Tubal ligation      FAMILY HISTORY:  Family History  Problem Relation Age of Onset  . Diabetes Mother   . Hypertension Mother   . Breast cancer Maternal Grandmother     SOCIAL HISTORY:  History  Substance Use Topics  . Smoking status: Never Smoker   . Smokeless tobacco: Never Used  . Alcohol Use: No    ALLERGIES: Review of patient's allergies indicates no known allergies.  MEDICATIONS:  No current outpatient prescriptions on file.   No current facility-administered medications for this encounter.    REVIEW OF SYSTEMS:  A 15 point review of systems is documented in the electronic  medical record. This was obtained by the nursing staff. However, I reviewed this with the patient to discuss relevant findings and make appropriate changes.  Pertinent items are noted in HPI.Positive for dizziness and occasional palpitations.   PHYSICAL EXAM:   Wt Readings from Last 3 Encounters:  11/23/13 179 lb 9.6 oz (81.466 kg)  11/15/13 185 lb 6.4 oz (84.097 kg)   Temp Readings from Last 3 Encounters:  11/23/13 99 F (37.2 C) Oral  11/15/13 99.6 F (37.6 C) Oral   BP Readings from Last 3 Encounters:  11/23/13 124/56  11/15/13 120/78   Pulse Readings from Last 3 Encounters:  11/23/13 82     LABORATORY DATA:  Lab Results  Component Value Date   WBC 3.8* 11/23/2013   HGB 6.7 Repeated and Verified* 11/23/2013   HCT 24.4* 11/23/2013   MCV 58.6* 11/23/2013   PLT 273 11/23/2013   Lab Results  Component Value Date   NA 140 11/23/2013   K 3.9 11/23/2013   CO2 24 11/23/2013   Lab Results  Component Value Date   ALT 31 11/23/2013   AST 22 11/23/2013   ALKPHOS 99 11/23/2013   BILITOT 0.66 11/23/2013     RADIOGRAPHY: Mr Breast Bilateral W Wo Contrast  11/22/2013   CLINICAL DATA:  48 year old female with right breast biopsy proven invasive ductal carcinoma and ductal carcinoma in-situ.  LABS:  None obtained at the time of study.  EXAM:  BILATERAL BREAST MRI WITH AND WITHOUT CONTRAST  TECHNIQUE: Multiplanar, multisequence MR images of both breasts were obtained prior to and following the intravenous administration of 43ml of MultiHance.  THREE-DIMENSIONAL MR IMAGE RENDERING ON INDEPENDENT WORKSTATION:  Three-dimensional MR images were rendered by post-processing of the original MR data on an independent workstation. The three-dimensional MR images were interpreted, and findings are reported in the following complete MRI report for this study. Three dimensional images were evaluated at the independent DynaCad workstation  COMPARISON:  Previous exams  FINDINGS: Breast composition: c:   Heterogeneous fibroglandular tissue  Background parenchymal enhancement: Moderate  Right breast: There is an irregular enhancing mass in the anterior third of the upper-outer quadrant of the right breast measuring 3.1 x 1.6 x 2.5 cm in anterior posterior, transverse and craniocaudal dimensions. The mass is directly contiguous with the nipple which enhances. A signal void artifact is seen in the mass from the biopsy clip.  Left breast: No mass or abnormal enhancement.  Lymph nodes: A 2.9 cm level 1 right axillary lymph node is visualized. It maintains a normal fatty hilum. However, the cortex appears mildly prominent. No enlarged adenopathy was seen sonographically. Correlation with sentinel lymph node biopsy is recommended.  Ancillary findings:  None.  IMPRESSION: 3.1 cm irregular enhancing mass with nipple involvement in the anterior third of the upper-outer quadrant of the right breast. Mildly prominent 2.9 cm level 1 right axillary lymph node.  RECOMMENDATION: Treatment planning of the known right breast invasive mammary carcinoma and carcinoma in situ is recommended. Mildly prominent level 1 right axillary lymph node. Correlation with sentinel lymph node biopsy is recommended.  BI-RADS CATEGORY  6: Known biopsy-proven malignancy.   Electronically Signed   By: Lillia Mountain M.D.   On: 11/22/2013 11:23   Mm Digital Diagnostic Bilat  11/15/2013   CLINICAL DATA:  Questioned palpable finding per patient right breast 10 o'clock location with greenish spontaneous right nipple discharge  EXAM: DIGITAL DIAGNOSTIC  bilateral MAMMOGRAM WITH CAD  ULTRASOUND right BREAST  COMPARISON:  This is the patient's baseline exam  ACR Breast Density Category c: The breast tissue is heterogeneously dense, which may obscure small masses.  FINDINGS: An obscured mass is identified in the right breast 10 o'clock location subareolar area. No other suspicious finding is seen in either breast.  Mammographic images were processed with CAD.   On physical exam, I palpate a firm mass right breast 10 o'clock location 1 cm from the nipple. There is right nipple excoriation.  Ultrasound is performed, showing an irregular hypoechoic shadowing mass right breast 10 o'clock location 1 cm from the nipple with direct contiguous subjacent nipple involvement and abnormal internal hypervascularity, measuring overall 1.8 x 1.4 x 1.2 cm. A normal-appearing 6 mm short axis right axillary lymph node is identified.  IMPRESSION: Highly suspicious right breast mass 10 o'clock location. Ultrasound-guided core biopsy will be performed today and dictated separately. No right axillary lymphadenopathy or evidence for malignancy in the left breast mammographically. An interpreter was present for discussion of the findings and performance of the exam.  RECOMMENDATION: Right ultrasound-guided core biopsy  I have discussed the findings and recommendations with the patient. Results were also provided in writing at the conclusion of the visit. If applicable, a reminder letter will be sent to the patient regarding the next appointment.  BI-RADS CATEGORY  4: Suspicious.   Electronically Signed   By: Conchita Paris M.D.   On: 11/15/2013 10:55   Mm Digital Diagnostic Unilat R  11/22/2013   ADDENDUM REPORT: 11/22/2013 11:05   Electronically Signed   By: Lillia Mountain M.D.   On: 11/22/2013 11:05   11/22/2013   CLINICAL DATA:  Post ultrasound-guided breast biopsy  EXAM: DIAGNOSTIC right MAMMOGRAM POST ULTRASOUND BIOPSY  COMPARISON:  Previous exams  FINDINGS: Mammographic images were obtained following ultrasound guided biopsy of right breast mass 10 o'clock location. The ribbon shaped clip is appropriately located.  IMPRESSION: Appropriate ribbon shaped clip location, right breast mass 10 o'clock location.  Final Assessment: Post Procedure Mammograms for Marker Placement  Electronically Signed: By: Conchita Paris M.D. On: 11/15/2013 11:48   US Breast Ltd Uni Right Inc  Axilla  11/15/2013   CLINICAL DATA:  Questioned palpable finding per patient right breast 10 o'clock location with greenish spontaneous right nipple discharge  EXAM: DIGITAL DIAGNOSTIC  bilateral MAMMOGRAM WITH CAD  ULTRASOUND right BREAST  COMPARISON:  This is the patient's baseline exam  ACR Breast Density Category c: The breast tissue is heterogeneously dense, which may obscure small masses.  FINDINGS: An obscured mass is identified in the right breast 10 o'clock location subareolar area. No other suspicious finding is seen in either breast.  Mammographic images were processed with CAD.  On physical exam, I palpate a firm mass right breast 10 o'clock location 1 cm from the nipple. There is right nipple excoriation.  Ultrasound is performed, showing an irregular hypoechoic shadowing mass right breast 10 o'clock location 1 cm from the nipple with direct contiguous subjacent nipple involvement and abnormal internal hypervascularity, measuring overall 1.8 x 1.4 x 1.2 cm. A normal-appearing 6 mm short axis right axillary lymph node is identified.  IMPRESSION: Highly suspicious right breast mass 10 o'clock location. Ultrasound-guided core biopsy will be performed today and dictated separately. No right axillary lymphadenopathy or evidence for malignancy in the left breast mammographically. An interpreter was present for discussion of the findings and performance of the exam.  RECOMMENDATION: Right ultrasound-guided core biopsy  I have discussed the findings and recommendations with the patient. Results were also provided in writing at the conclusion of the visit. If applicable, a reminder letter will be sent to the patient regarding the next appointment.  BI-RADS CATEGORY  4: Suspicious.   Electronically Signed   By: Conchita Paris M.D.   On: 11/15/2013 10:55   [place] Rt Breast Bx W Loc Dev 1st Lesion Img Bx Spec US Guide  11/16/2013   ADDENDUM REPORT: 11/16/2013 12:05  ADDENDUM: Pathology revealed grade III invasive  ductal carcinoma and ductal carcinoma in situ in the right breast. This was found to be concordant by Dr. Pamelia Hoit. Pathology was discussed with the patient by telephone via a conference call, using Lattie Corns as an interpreter. She reported doing well after the biopsy. Post biopsy instructions were reviewed and her questions were answered. She has been [facility] at [facility] on November [facility], 2013. A bilateral breast MRI will be scheduled for the patient. Noelia's contact information was given to the patient so she could call in and conference call in the future should she have additional questions and concerns.  Pathology results reported by Susa Raring RN, BSN on November 16, 2013.   Electronically Signed   By: Conchita Paris M.D.   On: 11/16/2013 12:05   11/16/2013   CLINICAL DATA:  Suspicious right breast mass 10 o'clock location  EXAM: ULTRASOUND GUIDED right BREAST CORE NEEDLE BIOPSY  COMPARISON:  Previous exams.  PROCEDURE: I met with the patient  and we discussed the procedure of ultrasound-guided biopsy, including benefits and alternatives. We discussed the high likelihood of a successful procedure. We discussed the risks of the procedure including infection, bleeding, tissue injury, clip migration, and inadequate sampling. Informed written consent was given. The usual time-out protocol was performed immediately prior to the procedure.  Using sterile technique and 2% Lidocaine as local anesthetic, under direct ultrasound visualization, a 12 gauge vacuum-assisteddevice was used to perform biopsy of right breast mass 10 o'clock location using a lateral to medial approach. At the conclusion of the procedure, a ribbon shaped tissue marker clip was deployed into the biopsy cavity. Follow-up 2-view mammogram was performed and dictated separately.  IMPRESSION: Ultrasound-guided biopsy of right breast mass 10 o'clock location with ribbon shaped clip placement.  Pathology is pending. No apparent complications.  Electronically Signed: By: Conchita Paris M.D. On: 11/15/2013 10:55      IMPRESSION: T2N0 Invasive Ductal Carcinoma of the Right Breast  PLAN:I spoke to the patient today regarding her diagnosis and options for treatment. We discussed the equivalence in terms of survival and local failure between mastectomy and breast conservation. We discussed the role of radiation in decreasing local failures in patients who undergo mastectomy and have positive lymph nodes or tumors greater than 5 cm. We discussed the role of radiation and decreasing local failures in patients who undergo lumpectomy.We discussed the possible side effects including but not limited to asymptomatic rib, heart and lung damage, heart disease, skin redness, fatigue, permanent skin darkening, and chest wall swelling. We discussed increased complications that can occur with reconstruction after radiation. We discussed the process of simulation and the placement tattoos. We discussed the low likelihood of secondary malignancies. We discussed that she would undergo neoadjuvant chemotherapy first followed by surgery and radiation. She also needs further work up of her anemia.   I spent 40 minutes  face to face with the patient and more than 50% of that time was spent in counseling and/or coordination of care.   ------------------------------------------------  Thea Silversmith, MD

## 2013-11-24 ENCOUNTER — Encounter: Payer: Self-pay | Admitting: Genetic Counselor

## 2013-11-24 ENCOUNTER — Other Ambulatory Visit: Payer: Self-pay

## 2013-11-24 ENCOUNTER — Ambulatory Visit (HOSPITAL_BASED_OUTPATIENT_CLINIC_OR_DEPARTMENT_OTHER): Payer: Self-pay | Admitting: Genetic Counselor

## 2013-11-24 DIAGNOSIS — IMO0002 Reserved for concepts with insufficient information to code with codable children: Secondary | ICD-10-CM

## 2013-11-24 DIAGNOSIS — C50919 Malignant neoplasm of unspecified site of unspecified female breast: Secondary | ICD-10-CM | POA: Insufficient documentation

## 2013-11-24 DIAGNOSIS — Z803 Family history of malignant neoplasm of breast: Secondary | ICD-10-CM

## 2013-11-24 DIAGNOSIS — C50411 Malignant neoplasm of upper-outer quadrant of right female breast: Secondary | ICD-10-CM

## 2013-11-24 DIAGNOSIS — C50419 Malignant neoplasm of upper-outer quadrant of unspecified female breast: Secondary | ICD-10-CM

## 2013-11-24 NOTE — Progress Notes (Signed)
Patient Name: Jennifer Raymond Patient Age: 48 y.o. Encounter Date: 11/24/2013  Referring Physician: Nicholas Lose, MD  Primary Care Provider: Garnette Czech, MD   Jennifer Raymond, a 48 y.o. female, is being seen at the [facility] due to a personal and family history of breast cancer. She presents to clinic today with her husband and Spanish interpreter, Gregary Signs, to discuss the possibility of a hereditary predisposition to cancer and discuss whether genetic testing is warranted.  HISTORY OF PRESENT ILLNESS: Jennifer Raymond was recently diagnosed with right cancer at the age of 9. The breast tumor was ER negative, PR negative, and HER2 negative. She stated that surgical decision is pending results of genetic testing.   Past Medical History  Diagnosis Date  . Anemia   . Hot flashes   . Malignant neoplasm of breast (female), unspecified site   . Family history of malignant neoplasm of breast     Past Surgical History  Procedure Laterality Date  . Cesarean section      2 previous  . Wrist surgery due to fracture    . Tubal ligation      History   Social History  . Marital Status: Married    Spouse Name: N/A    Number of Children: N/A  . Years of Education: N/A   Social History Main Topics  . Smoking status: Never Smoker   . Smokeless tobacco: Never Used  . Alcohol Use: No  . Drug Use: No  . Sexual Activity: Yes    Birth Control/ Protection: Surgical   Other Topics Concern  . Not on file   Social History Narrative  . No narrative on file     FAMILY HISTORY:   During the visit, a 4-generation pedigree was obtained. Family tree will be sent for scanning and will be in EPIC under the Media tab.  Significant diagnoses include the following:  Family History  Problem Relation Age of Onset  . Diabetes Mother   . Hypertension Mother   . Breast cancer Maternal Grandmother     age dx?; deceased 47  . Leukemia Paternal Aunt     currently 22s     Additionally, Jennifer Raymond has a son (age 54) and a daughter (age 48) as well as two sisters (age 39 and 15) and a brother (age 73). Her mother is cancer-free at 19. Her mother has 4 sisters and 7 brothers who are all cancer-free.  Jennifer Raymond father died at age 49 due to an MI. He only had one sibling, a sister who is in her 57s with leukemia, as noted above. She has no information regarding her paternal grandfather and her paternal grandmother is living at 25, cancer-free.  Jennifer Raymond ancestry is Hispanic. There is no known Jewish ancestry and no consanguinity.  ASSESSMENT AND PLAN: Jennifer Raymond is a 48 y.o. female with a personal history of triple negative breast cancer and family history of breast cancer in her maternal grandmother. This maternal history is not suggestive of a hereditary predisposition to cancer given her very large maternal family. Her own triple negative breast cancer, however, is concerning and thus, genetic testing is indicate. We reviewed the characteristics, features and inheritance patterns of hereditary cancer syndromes. We also discussed genetic testing, including the appropriate family members to test, and the process of testing. Since she has no insurance, a Editor, commissioning was submitted with her sample to [employer].    Jennifer Raymond wished to pursue genetic testing and  a blood sample will be sent to [facility] for analysis. Given that she would like to use this information to decide what surgery to pursue, BRCA1 and BRCA2 testing was ordered as a STAT test. If no mutation is identified, we asked for reflex testing to the genes on the BreastNext gene panel. We discussed the implications of a positive, negative and/ or Variant of Uncertain Significance (VUS) result. BRCA results should be available in approximately 2 weeks, followed by the other results 2-3 weeks after that. We will contact her once each result is obtained and address implications for her as well as  address genetic testing for at-risk family members, if needed.    We encouraged Jennifer Raymond to remain in contact with Cancer Genetics annually so that we can update the family history and inform her of any changes in cancer genetics and testing that may be of benefit for this family. Ms.  Raymond questions were answered to her satisfaction today.   Thank you for the referral and allowing [place] to share in the care of your patient.   The patient was seen for a total of 30 minutes, greater than 50% of which was spent face-to-face counseling. This patient was discussed with the overseeing provider who agrees with the above.   Steele Berg, MS, [place] Certified Genetic Counseor phone: [phone] [email or web address]

## 2013-11-25 ENCOUNTER — Ambulatory Visit (HOSPITAL_BASED_OUTPATIENT_CLINIC_OR_DEPARTMENT_OTHER): Payer: Self-pay

## 2013-11-25 VITALS — BP 114/52 | HR 73 | Temp 98.4°F | Resp 18

## 2013-11-25 DIAGNOSIS — D509 Iron deficiency anemia, unspecified: Secondary | ICD-10-CM

## 2013-11-25 MED ORDER — SODIUM CHLORIDE 0.9 % IV SOLN
Freq: Once | INTRAVENOUS | Status: AC
  Administered 2013-11-25: 11:00:00 via INTRAVENOUS

## 2013-11-25 MED ORDER — SODIUM CHLORIDE 0.9 % IV SOLN
1020.0000 mg | Freq: Once | INTRAVENOUS | Status: AC
  Administered 2013-11-25: 1020 mg via INTRAVENOUS
  Filled 2013-11-25: qty 34

## 2013-11-25 NOTE — Patient Instructions (Signed)

## 2013-11-28 ENCOUNTER — Encounter: Payer: Self-pay | Admitting: *Deleted

## 2013-11-28 NOTE — Progress Notes (Signed)
[place] Psychosocial Distress Screening Clinical Social Work  Patient completed distress screening protocol and scored a 10 on the Psychosocial Distress Thermometer which indicates severe distress. Clinical Social Worker met with patient, patients husband, and interpreter in [facility] on 11/23/13 to assess for distress and other psychosocial needs.  Patient stated it was helpful getting information on her treatment plan and meeting with the physicians, but she was still feeling "nervous" about surgery and the treatment process.   CSW validated patients feelings and concerns and discussed the importance of emotional support during treatment. CSW also informed patient of the support team and support services at [facility].  Patient did not wish to be referred to an [place] at this time, but CSW encouraged patient to call with any questions or concerns.      ONCBCN DISTRESS SCREENING 11/23/2013  Screening Type Initial Screening  Jennifer Raymond the number that describes how much distress you have been experiencing in the past week 10  Practical problem type Insurance  Emotional problem type Nervousness/Anxiety;Adjusting to illness  Information Concerns Type Lack of info about treatment  Physician notified of physical symptoms Yes  Referral to clinical psychology No  Referral to clinical social work Yes  Referral to financial advocate Yes   [name], MSW, LCSW, OSW-C Clinical Social Worker [place] [phone]

## 2013-11-30 ENCOUNTER — Encounter: Payer: Self-pay | Admitting: *Deleted

## 2013-11-30 ENCOUNTER — Other Ambulatory Visit: Payer: Self-pay | Admitting: Hematology and Oncology

## 2013-11-30 ENCOUNTER — Ambulatory Visit (HOSPITAL_BASED_OUTPATIENT_CLINIC_OR_DEPARTMENT_OTHER): Payer: Self-pay

## 2013-11-30 ENCOUNTER — Other Ambulatory Visit: Payer: Self-pay

## 2013-11-30 ENCOUNTER — Ambulatory Visit (HOSPITAL_COMMUNITY)
Admission: RE | Admit: 2013-11-30 | Discharge: 2013-11-30 | Disposition: A | Discharge status: Home / Self Care | Payer: Self-pay | Source: Ambulatory Visit | Attending: Hematology and Oncology | Admitting: Hematology and Oncology

## 2013-11-30 DIAGNOSIS — D509 Iron deficiency anemia, unspecified: Secondary | ICD-10-CM

## 2013-11-30 DIAGNOSIS — C50419 Malignant neoplasm of upper-outer quadrant of unspecified female breast: Secondary | ICD-10-CM | POA: Insufficient documentation

## 2013-11-30 DIAGNOSIS — C50919 Malignant neoplasm of unspecified site of unspecified female breast: Secondary | ICD-10-CM

## 2013-11-30 DIAGNOSIS — C50411 Malignant neoplasm of upper-outer quadrant of right female breast: Secondary | ICD-10-CM

## 2013-11-30 DIAGNOSIS — Z01818 Encounter for other preprocedural examination: Secondary | ICD-10-CM | POA: Insufficient documentation

## 2013-11-30 LAB — FECAL OCCULT BLOOD, GUAIAC: Occult Blood: NEGATIVE

## 2013-11-30 NOTE — Progress Notes (Signed)
Echocardiogram 2D Echocardiogram has been performed.  Jennifer Raymond 11/30/2013, 10:01 AM

## 2013-12-01 ENCOUNTER — Telehealth: Payer: Self-pay | Admitting: Family Medicine

## 2013-12-01 NOTE — Telephone Encounter (Signed)
Message copied by Boykin Nearing on Thu Dec 01, 2013  1:09 PM ------      Message from: Lucia Gaskins, DAVID H      Created: Wed Nov 30, 2013  4:18 PM      Regarding: a new patient you are going to see       Dr. Adrian Blackwater,            Please call me about this patient.  My office number is [phone].      I am not sure that you check Epic anymore, but this is worth a try.            Thanks,      Alphonsa Overall ------

## 2013-12-01 NOTE — Telephone Encounter (Signed)
Called back to Dr. Nadara Eaton. I had no particular questions or concerns. Just touching bases with him, since as of tomorrow, we will share a patient with multiple active medical problems.

## 2013-12-01 NOTE — Telephone Encounter (Signed)
Called back to Dr. Lucia Gaskins, surgery, to discuss the patient and her recent diagnoses including triple negative R breast cancer and iron deficiency anemia.  The plan now is GI evaluation to rule out GI malignancy. I will see her tomorrow for pelvic exam, discuss periods and options for heavy menstrual bleeding including IUD and possible GYN referral for evaluation of the endometrium and possible endometrial ablation.  Will plan to get a idea of patient's understanding of recent diagnoses and attempt to answer any questions or concerns she has about the plan/work up.    Called Dr. Wylene Men, med/onc, he was not available. Left message requesting a call back to touch bases and assure that we are on the same page.

## 2013-12-02 ENCOUNTER — Ambulatory Visit: Payer: Self-pay | Attending: Family Medicine | Admitting: Family Medicine

## 2013-12-02 ENCOUNTER — Encounter: Payer: Self-pay | Admitting: Family Medicine

## 2013-12-02 ENCOUNTER — Other Ambulatory Visit (HOSPITAL_COMMUNITY)
Admission: RE | Admit: 2013-12-02 | Discharge: 2013-12-02 | Disposition: A | Discharge status: Home / Self Care | Payer: Self-pay | Source: Ambulatory Visit | Attending: Family Medicine | Admitting: Family Medicine

## 2013-12-02 VITALS — BP 133/68 | HR 64 | Temp 98.4°F | Resp 18 | Ht 59.0 in | Wt 174.0 lb

## 2013-12-02 DIAGNOSIS — N920 Excessive and frequent menstruation with regular cycle: Secondary | ICD-10-CM | POA: Insufficient documentation

## 2013-12-02 DIAGNOSIS — D509 Iron deficiency anemia, unspecified: Secondary | ICD-10-CM | POA: Insufficient documentation

## 2013-12-02 DIAGNOSIS — N760 Acute vaginitis: Secondary | ICD-10-CM | POA: Insufficient documentation

## 2013-12-02 DIAGNOSIS — C50919 Malignant neoplasm of unspecified site of unspecified female breast: Secondary | ICD-10-CM | POA: Insufficient documentation

## 2013-12-02 DIAGNOSIS — Z113 Encounter for screening for infections with a predominantly sexual mode of transmission: Secondary | ICD-10-CM | POA: Insufficient documentation

## 2013-12-02 NOTE — Patient Instructions (Signed)
Delight Ovens,  Gracias por venido hoy.  1. For anemia with heavy menstrual periods.  You will be called with CBC and pap smear results. You will also be called with the appointment for your pelvic ultrasound.  F/u with me in 3-4 weeks   Dr. Adrian Blackwater

## 2013-12-02 NOTE — Assessment & Plan Note (Signed)
A: menses are regular and heavy per report P: Pap done and pelvic ultrasound ordered to evaluate endometrial lining. Patient has gyn appt for next month  I anticipate plan for endometrial biopsy.

## 2013-12-02 NOTE — Progress Notes (Signed)
   Subjective:    Patient ID: Jennifer Raymond, female    DOB: [birth year], 48 y.o.   MRN: [id number] CC: establish care, recent diagnosis of breast cancer, recent diagnosis of IDA  HPI 48 yo F presents with her husband who is also my patient to establish care and discuss the following:  1. Breast cancer: recently diagnosed. Feeling well. Thinks that she is currently at a period of waiting. Waiting for EGD/colonscopy and waiting for results of genetic testing. Is planning to go forward with chemotherapy and surgery but is not sure which will be first. Request that I place a GI referral to potentially lower cost of planned EGD/colonoscopy.   2. Iron deficiency anemia: is s/p IV iron. Energy has improved. No longer craving ice. No blood in stools. Reports menstrual cycles last 6 days, occur regularly, have become heavier in flow over the past few years. Days 2-4 are the heaviest days. Menses are not particularly painful.    3. HM: patient reports having pap done in March of 2015 at [facility] during a screening fair. The pap was normal, she received a letter.   Soc hx: non smoker  Review of Systems As per HPI     Objective:   Physical Exam BP 133/68  Pulse 64  Temp(Src) 98.4 F (36.9 C) (Oral)  Resp 18  Ht 4\' 11"  (1.499 m)  Wt 174 lb (78.926 kg)  BMI 35.13 kg/m2  SpO2 100%  LMP 11/05/2013 General appearance: alert, cooperative and no distress Abdomen: soft, non-tender; bowel sounds normal; no masses,  no organomegaly Pelvic: cervix normal in appearance, external genitalia normal, no adnexal masses or tenderness, no cervical motion tenderness, rectovaginal septum normal, uterus normal size, shape, and consistency and vagina normal without discharge  Repeat pap done today     Assessment & Plan:

## 2013-12-02 NOTE — Assessment & Plan Note (Addendum)
A; treated with IV iron P: Repeat CBC today Pap done and pelvic ultrasound ordered to evaluate endometrial lining. Patient has gyn appt for next month  I anticipate plan for endometrial biopsy.  EGD and colonoscopy planned, GI referral placed by me today.

## 2013-12-02 NOTE — Progress Notes (Signed)
Establish Care Pt stated had a mammogram and was Dx with breast cancer and anemia Requested referral for endoscopy and coonoscopy

## 2013-12-03 LAB — CBC
HEMATOCRIT: 30.1 % — AB (ref 36.0–46.0)
HEMOGLOBIN: 8.4 g/dL — AB (ref 12.0–15.0)
MCH: 18.3 pg — AB (ref 26.0–34.0)
MCHC: 27.9 g/dL — AB (ref 30.0–36.0)
MCV: 65.4 fL — AB (ref 78.0–100.0)
Platelets: 278 10*3/uL (ref 150–400)
RBC: 4.6 MIL/uL (ref 3.87–5.11)
RDW: 26.5 % — ABNORMAL HIGH (ref 11.5–15.5)
WBC: 4.5 10*3/uL (ref 4.0–10.5)

## 2013-12-05 ENCOUNTER — Telehealth: Payer: Self-pay | Admitting: *Deleted

## 2013-12-05 ENCOUNTER — Encounter: Payer: Self-pay | Admitting: Internal Medicine

## 2013-12-05 LAB — CERVICOVAGINAL ANCILLARY ONLY
WET PREP (BD AFFIRM): NEGATIVE
Wet Prep (BD Affirm): NEGATIVE
Wet Prep (BD Affirm): NEGATIVE

## 2013-12-05 NOTE — Telephone Encounter (Signed)
Pt aware of [place] appointment at 7:30 arriving 15 min early on Oct 09,2015 with full bladder

## 2013-12-05 NOTE — Addendum Note (Signed)
Addended by: Boykin Nearing on: 12/05/2013 11:50 AM   Modules accepted: Orders

## 2013-12-05 NOTE — Addendum Note (Signed)
Addended by: Boykin Nearing on: 12/05/2013 12:41 PM   Modules accepted: Orders

## 2013-12-05 NOTE — Telephone Encounter (Signed)
Message copied by Betti Cruz on Mon Dec 05, 2013  3:41 PM ------      Message from: Boykin Nearing      Created: Fri Dec 02, 2013  7:13 PM       Alyn Riedinger-please schedule pelvic US and call patient with appt details.       Nora-patient already has EGD/colonoscopy scheduled but is concerned about cost. Please let her know if there are any lower cost options. I have placed a referral.       Drs. Lucia Gaskins and [place], [place].         ------

## 2013-12-06 LAB — CERVICOVAGINAL ANCILLARY ONLY
Chlamydia: NEGATIVE
NEISSERIA GONORRHEA: NEGATIVE

## 2013-12-06 LAB — CYTOLOGY - PAP

## 2013-12-07 ENCOUNTER — Telehealth: Payer: Self-pay | Admitting: *Deleted

## 2013-12-07 ENCOUNTER — Encounter (HOSPITAL_COMMUNITY): Admission: RE | Payer: Self-pay | Source: Ambulatory Visit

## 2013-12-07 ENCOUNTER — Ambulatory Visit (HOSPITAL_COMMUNITY): Admission: RE | Admit: 2013-12-07 | Payer: Self-pay | Source: Ambulatory Visit | Admitting: Surgery

## 2013-12-07 SURGERY — EGD (ESOPHAGOGASTRODUODENOSCOPY)
Anesthesia: Moderate Sedation

## 2013-12-07 NOTE — Progress Notes (Signed)
Pt aware of results 

## 2013-12-07 NOTE — Telephone Encounter (Signed)
Message copied by Betti Cruz on Wed Dec 07, 2013 11:13 AM ------      Message from: Boykin Nearing      Created: Mon Dec 05, 2013 12:08 PM       Improved Hgb from 6.7 to 8.4 ------

## 2013-12-07 NOTE — Telephone Encounter (Signed)
Pt aware of lab results 

## 2013-12-08 ENCOUNTER — Telehealth: Payer: Self-pay | Admitting: *Deleted

## 2013-12-08 ENCOUNTER — Encounter: Payer: Self-pay | Admitting: Genetic Counselor

## 2013-12-08 NOTE — Progress Notes (Signed)
Referring Physician: Nicholas Lose, MD   Jennifer Raymond was called today to discuss the first of her genetic test results. Please see the Genetics note from her visit on 11/24/13.   GENETIC TESTING: At the time of Ms. Ms. Doepke visit, we recommended she pursue genetic testing of multiple genes. Given the use of this information for surgical management, BRCA1 and BRCA2 testing was ordered first. This test included sequencing and deletion/duplication analysis, and was performed at [facility]. Testing was normal and did not reveal a mutation in these two genes. Testing for the other genes on the BreastNext panel is pending. Once results are obtained, Ms. Luellen will be called again. She was advised to speak with her providers regarding scheduling her surgery and not waiting until the results of additional testing.   Steele Berg, MS, [place] Certified Genetic Counseor phone: [phone] [email or web address]

## 2013-12-08 NOTE — Telephone Encounter (Signed)
Message copied by Betti Cruz on Thu Dec 08, 2013  3:00 PM ------      Message from: Boykin Nearing      Created: Thu Dec 08, 2013 11:20 AM       Normal pap, repeat in 3 years ------

## 2013-12-08 NOTE — Telephone Encounter (Signed)
Pt aware of Pap smear, wep prep and gc/Chlamydia results

## 2013-12-09 ENCOUNTER — Ambulatory Visit (HOSPITAL_COMMUNITY): Payer: Self-pay

## 2013-12-16 ENCOUNTER — Ambulatory Visit (HOSPITAL_COMMUNITY)
Admission: RE | Admit: 2013-12-16 | Discharge: 2013-12-16 | Disposition: A | Discharge status: Home / Self Care | Payer: Self-pay | Source: Ambulatory Visit | Attending: Family Medicine | Admitting: Family Medicine

## 2013-12-16 ENCOUNTER — Ambulatory Visit (HOSPITAL_COMMUNITY): Payer: Self-pay

## 2013-12-16 DIAGNOSIS — D271 Benign neoplasm of left ovary: Secondary | ICD-10-CM | POA: Insufficient documentation

## 2013-12-16 DIAGNOSIS — N920 Excessive and frequent menstruation with regular cycle: Secondary | ICD-10-CM

## 2013-12-20 ENCOUNTER — Encounter: Payer: Self-pay | Admitting: Family Medicine

## 2013-12-20 DIAGNOSIS — D250 Submucous leiomyoma of uterus: Secondary | ICD-10-CM | POA: Insufficient documentation

## 2013-12-26 ENCOUNTER — Telehealth: Payer: Self-pay | Admitting: *Deleted

## 2013-12-26 NOTE — Telephone Encounter (Signed)
Message copied by Betti Cruz on Mon Dec 26, 2013  3:15 PM ------      Message from: Boykin Nearing      Created: Tue Dec 20, 2013  2:52 PM       Patient with submucosal fibroid on uterine ultrasound. Patient to keep f/u with gyn.        ------

## 2013-12-26 NOTE — Telephone Encounter (Signed)
Pt aware of results, has GYN apt on Oct 10

## 2013-12-30 ENCOUNTER — Encounter: Payer: Self-pay | Admitting: Genetic Counselor

## 2013-12-30 NOTE — Progress Notes (Signed)
GENETIC TEST RESULTS  Patient Name: Jennifer Raymond Patient Age: 48 y.o. Encounter Date: 12/30/2013  Referring Physician: Nicholas Lose, MD   Jennifer Raymond was called today to discuss genetic test results. Please see the Genetics note from her visit on 11/24/13 for a detailed discussion of her personal and family history.  GENETIC TESTING: At the time of Jennifer Raymond visit, we recommended she pursue genetic testing of multiple genes on the BreastNext gene panel. This test, which included sequencing and deletion/duplication analysis of 17 genes, was performed at [facility]. Testing was normal and did not reveal a mutation in these genes. The genes tested were ATM, BARD1, BRCA1, BRCA2, BRIP1, CDH1, CHEK2, MRE11A, MUTYH, NBN, NF1, PALB2, PTEN, RAD50, RAD51C, RAD51D, and TP53. We spoke previously on 12/08/13 to disclose BRCA results to use for surgical decision-making.  We discussed with Jennifer Raymond that since the current test is not perfect, it is possible there may be a gene mutation that current testing cannot detect, but that chance is small. We also discussed that it is possible that a different genetic factor, which was not part of this testing or has not yet been discovered, is responsible for the cancer diagnoses in the family. Given her family history, this is of lowlikelihood. Should Jennifer Raymond wish to discuss or pursue this additional testing, we are happy to coordinate this at any time, but do not feel that she is at significant risk of harboring a mutation in a different gene.     CANCER SCREENING: This result suggests that Jennifer Raymond's cancer was most likely not due to an inherited predisposition. Most cancers happen by chance and this negative test, along with details of her family history, suggests that her cancer falls into this category. We, therefore, recommended she continue to follow the cancer screening guidelines provided by her physician.   FAMILY MEMBERS: Women in the family  are at some increased risk of developing breast cancer, over the general population risk, simply due to the family history. We recommended they have a yearly mammogram beginning at age 13, a yearly clinical breast exam, and perform monthly breast self-exams. A gynecologic exam is recommended yearly. Colon cancer screening is recommended to begin by age 71.  Lastly, we discussed with Jennifer Raymond that cancer genetics is a rapidly advancing field and it is possible that new genetic tests will be appropriate for her in the future. We encouraged her to remain in contact with [place] on an annual basis so we can update her personal and family histories, and let her know of advances in cancer genetics that may benefit the family. Our contact number was provided. Jennifer Raymond questions were answered to her satisfaction today, and she knows she is welcome to call anytime with additional questions.    Steele Berg, MS, [place] Certified Genetic Counselor phone: [phone] [email or web address]

## 2014-01-02 ENCOUNTER — Encounter: Payer: Self-pay | Admitting: Family Medicine

## 2014-01-03 ENCOUNTER — Encounter: Payer: Self-pay | Admitting: Gastroenterology

## 2014-01-03 ENCOUNTER — Ambulatory Visit (INDEPENDENT_AMBULATORY_CARE_PROVIDER_SITE_OTHER): Payer: Self-pay | Admitting: Gastroenterology

## 2014-01-03 ENCOUNTER — Other Ambulatory Visit: Payer: Self-pay

## 2014-01-03 VITALS — BP 145/72 | HR 55 | Temp 98.2°F | Ht 59.0 in | Wt 163.0 lb

## 2014-01-03 DIAGNOSIS — D509 Iron deficiency anemia, unspecified: Secondary | ICD-10-CM

## 2014-01-03 NOTE — Patient Instructions (Signed)
1. Colonoscopy with possible upper endoscopy with Dr. Gala Romney. See separate instructions.

## 2014-01-03 NOTE — Progress Notes (Signed)
cc'ed to pcp °

## 2014-01-03 NOTE — Progress Notes (Signed)
Reviewed her records in depth. Her oncologist is requesting EGD/TCS prior to starting chemo given profound IDA.  Please change my order to colonoscopy AND EGD (instead of possible EGD). Thanks!

## 2014-01-03 NOTE — Progress Notes (Signed)
Primary Care Physician:  Minerva Ends, MD  Primary Gastroenterologist:  Garfield Cornea, MD   Chief Complaint  Patient presents with  . Anemia    IRon Deficiency    HPI:  Jennifer Raymond is a 48 y.o. female here at the request of Dr. Adrian Blackwater for further evaluation of iron deficiency anemia. She presents with interpreter. EGD and colonoscopy have been requested actually by her oncologist Dr.Vinay Gudena prior to initiating chemotherapy given initial Hgb 6.7 in 11/2013. She did receive IV iron therapy.  Patient recently diagnosed with breast cancer and will require chemotherapy and surgery but she is not sure which we first. Recently received her genetics testing results and is waiting for her next appointment with the oncologist. For religious purposes she is not able to accept blood transfusions. She asked multiple questions today through her interpreter regarding minimizing her risk of bleeding with any sort of procedure. She is also scheduled to see a gyn given heavy menses and abnormal u/s.  Patient denies any melena or rectal bleeding. She was heme-negative 2. She's never had a colonoscopy. She also has regular menses every 28 days. Bleeds total of 6 days, first 3 days are heavy. Recently told she had fibroids. She denies any constipation, diarrhea. Her appetite is good. Denies any heartburn, dysphagia, vomiting, weight loss.   No current outpatient prescriptions on file.   No current facility-administered medications for this visit.    Allergies as of 01/03/2014  . (No Known Allergies)    Past Medical History  Diagnosis Date  . Anemia   . Hot flashes   . Malignant neoplasm of breast (female), unspecified site   . Family history of malignant neoplasm of breast     Past Surgical History  Procedure Laterality Date  . Cesarean section      2 previous  . Wrist surgery due to fracture    . Tubal ligation      Family History  Problem Relation Age of Onset  . Diabetes  Mother   . Hypertension Mother   . Breast cancer Maternal Grandmother     age dx?; deceased 17  . Leukemia Paternal Aunt     currently 64s  . Colon cancer Neg Hx     History   Social History  . Marital Status: Married    Spouse Name: N/A    Number of Children: N/A  . Years of Education: N/A   Occupational History  . Not on file.   Social History Main Topics  . Smoking status: Never Smoker   . Smokeless tobacco: Never Used  . Alcohol Use: No  . Drug Use: No  . Sexual Activity: Yes    Birth Control/ Protection: Surgical   Other Topics Concern  . Not on file   Social History Narrative      ROS:  General: Negative for anorexia, weight loss, fever, chills, fatigue, weakness. Eyes: Negative for vision changes.  ENT: Negative for hoarseness, difficulty swallowing , nasal congestion. CV: Negative for chest pain, angina, palpitations, dyspnea on exertion, peripheral edema.  Respiratory: Negative for dyspnea at rest, dyspnea on exertion, cough, sputum, wheezing.  GI: See history of present illness. GU:  Negative for dysuria, hematuria, urinary incontinence, urinary frequency, nocturnal urination.  MS: Negative for joint pain, low back pain.  Derm: Negative for rash or itching.  Neuro: Negative for weakness, abnormal sensation, seizure, frequent headaches, memory loss, confusion.  Psych: Negative for anxiety, depression, suicidal ideation, hallucinations.  Endo: Negative for unusual  weight change.  Heme: Negative for bruising or bleeding. Allergy: Negative for rash or hives.    Physical Examination:  BP 145/72 mmHg  Pulse 55  Temp(Src) 98.2 F (36.8 C) (Oral)  Ht 4\' 11"  (1.499 m)  Wt 163 lb (73.936 kg)  BMI 32.90 kg/m2  LMP 12/23/2013 (Approximate)   General: Well-nourished, well-developed in no acute distress.  Head: Normocephalic, atraumatic.   Eyes: Conjunctiva pink, no icterus. Mouth: Oropharyngeal mucosa moist and pink , no lesions erythema or  exudate. Neck: Supple without thyromegaly, masses, or lymphadenopathy.  Lungs: Clear to auscultation bilaterally.  Heart: Regular rate and rhythm, no murmurs rubs or gallops.  Abdomen: Bowel sounds are normal, nontender, nondistended, no hepatosplenomegaly or masses, no abdominal bruits or    hernia , no rebound or guarding.   Rectal: Not performed Extremities: No lower extremity edema. No clubbing or deformities.  Neuro: Alert and oriented x 4 , grossly normal neurologically.  Skin: Warm and dry, no rash or jaundice.   Psych: Alert and cooperative, normal mood and affect.  Labs: Lab Results  Component Value Date   WBC 4.5 12/02/2013   HGB 8.4* 12/02/2013   HCT 30.1* 12/02/2013   MCV 65.4* 12/02/2013   PLT 278 12/02/2013   Hemoglobin 6.7 on 11/23/2013  Lab Results  Component Value Date   FERRITIN <4* 11/23/2013   Lab Results  Component Value Date   IRON 12* 11/23/2013   TIBC 390 11/23/2013   FERRITIN <4* 11/23/2013   Lab Results  Component Value Date   CREATININE 0.8 11/23/2013   BUN 7.3 11/23/2013   NA 140 11/23/2013   K 3.9 11/23/2013   CO2 24 11/23/2013   Lab Results  Component Value Date   ALT 31 11/23/2013   AST 22 11/23/2013   ALKPHOS 99 11/23/2013   BILITOT 0.66 11/23/2013     Imaging Studies: US Transvaginal Non-ob  2013/12/17   CLINICAL DATA:  Menorrhagia with irregular cycle.  EXAM: TRANSABDOMINAL AND TRANSVAGINAL ULTRASOUND OF PELVIS  TECHNIQUE: Both transabdominal and transvaginal ultrasound examinations of the pelvis were performed. Transabdominal technique was performed for global imaging of the pelvis including uterus, ovaries, adnexal regions, and pelvic cul-de-sac. It was necessary to proceed with endovaginal exam following the transabdominal exam to visualize the endometrium, ovary and adnexal structures.  COMPARISON:  None  FINDINGS: Uterus  Measurements: 10.1 x 6.3 x 7.0 cm. There is a partial, sub mucosal fibroid within the left anterior fundus  measuring 2.6 x 2.1 x 2.5 cm.  Endometrium  Thickness: 10 mm.  No focal abnormality visualized.  Right ovary  Measurements: 4.1 x 2.2 x 2.7 cm. Normal appearance/no adnexal mass.  Left ovary  Measurements: Not visualized.  Other findings  None  IMPRESSION: 1. Left anterior fundal fibroid is partially submucosal and may explain the patient's menorrhagia. Consider further evaluation with sonohysterogram for confirmation prior to hysteroscopy. Endometrial sampling should also be considered if patient is at high risk for endometrial carcinoma. (Ref: Radiological Reasoning: Algorithmic Workup of Abnormal Vaginal Bleeding with Endovaginal Sonography and Sonohysterography. AJR 2008; 761:Y07-37)   Electronically Signed   By: Kerby Moors M.D.   On: 2013-12-17 14:14

## 2014-01-03 NOTE — Assessment & Plan Note (Signed)
48 year old Hispanic female, non-English-speaking, who presents with the interpreter today to discuss scheduling EGD and colonoscopy for iron deficiency anemia. Patient recently was diagnosed with breast cancer and seen by the oncologist. Labs revealed a hemoglobin of 6.7, MCV 58. She received IV iron 1. She is currently awaiting GYN appointment for abnormal pelvic ultrasound and heavy menses. Prior to initiating chemotherapy her oncologist would like for her to undergo EGD and colonoscopy.  Patient refuses blood products given her religious beliefs.  Colonoscopy and EGD in the near future with Dr. Gala Romney.  I have discussed the risks, alternatives, benefits with regards to but not limited to the risk of reaction to medication, bleeding, infection, perforation and the patient is agreeable to proceed. Written consent to be obtained.

## 2014-01-05 ENCOUNTER — Other Ambulatory Visit: Payer: Self-pay

## 2014-01-05 DIAGNOSIS — D509 Iron deficiency anemia, unspecified: Secondary | ICD-10-CM

## 2014-01-05 NOTE — Progress Notes (Signed)
Tried to move her TCS up to Nov 12 but she did not want to. She wanted to keep it on for the 01/24/14.

## 2014-01-06 ENCOUNTER — Telehealth: Payer: Self-pay | Admitting: Hematology and Oncology

## 2014-01-06 ENCOUNTER — Ambulatory Visit (INDEPENDENT_AMBULATORY_CARE_PROVIDER_SITE_OTHER): Payer: Self-pay | Admitting: Family Medicine

## 2014-01-06 ENCOUNTER — Encounter: Payer: Self-pay | Admitting: Family Medicine

## 2014-01-06 VITALS — BP 124/67 | HR 67 | Ht 59.0 in | Wt 161.4 lb

## 2014-01-06 DIAGNOSIS — D250 Submucous leiomyoma of uterus: Secondary | ICD-10-CM

## 2014-01-06 DIAGNOSIS — N920 Excessive and frequent menstruation with regular cycle: Secondary | ICD-10-CM

## 2014-01-06 NOTE — Progress Notes (Signed)
   Subjective:    Patient ID: Jennifer Raymond, female    DOB: 01/25/1966, 48 y.o.   MRN: [id number]  HPI Patient referred to our clinic due to heavy bleeding.  Patient describes regular menses that occur every 28 days.  Has heavy bleeding for the first 2-3 days of her period with severe cramping, but then decreases.  Menses lasts for 6 days. She had a pelvic ultrasound with a 44mm endometrial stripe with a small fibroid measuring 2.6cm.  She has not started treatment for her breast cancer.  She also has a history of anemia and has had iron infusion.  Last Hg was 8.4 on 12/02/13.   Review of Systems  Constitutional: Negative for fever, chills and fatigue.  Respiratory: Negative for chest tightness, shortness of breath and wheezing.   Gastrointestinal: Negative for nausea, vomiting, abdominal pain, diarrhea, constipation and blood in stool.       Objective:   Physical Exam  Constitutional: She appears well-developed and well-nourished.  Cardiovascular: Normal rate, regular rhythm and normal heart sounds.   Pulmonary/Chest: Effort normal and breath sounds normal.  Abdominal: Soft. Bowel sounds are normal. She exhibits no distension and no mass. There is no tenderness. There is no rebound and no guarding.  Skin: Skin is warm and dry.  Psychiatric: She has a normal mood and affect. Her behavior is normal. Judgment and thought content normal.       Assessment & Plan:   Problem List Items Addressed This Visit    Heavy menses   Fibroids, submucosal - Primary     Heavy bleeding likely secondary to fibroma is. Discussed etiology of the heavy menses. Her new major stripe is 10 mm, which is within the normal for a premenopausal woman. I discussed with her possible treatments for her heavy bleeding including Provera, Depo-Provera, Mirena IUD, endometrial ablation, and even hysterectomy.  Patient is unsure of whether she would want treatment, although it looks like her bleeding has caused her  anemia.  Controlling her bleeding may help with her anemia, especially during her cancer treatment.  She would like to think about her options and will contact us for any plans.

## 2014-01-06 NOTE — Telephone Encounter (Signed)
, °

## 2014-01-06 NOTE — Patient Instructions (Signed)
Medroxyprogesterone injection [Contraceptive] Qu es este medicamento? Las inyecciones anticonceptivas de MEDROXIPROGESTERONA previenen Water quality scientist. Las [employer] brindarn control de la natalidad durante 3 meses. La Depo-subQ Provera 104 se utiliza tambin para tratar [employer] relacionado con endometriosis. Este medicamento puede ser utilizado para otros usos; si tiene alguna pregunta consulte con su proveedor de atencin mdica o con su farmacutico. MARCAS COMERCIALES DISPONIBLES: Depo-Provera, Depo-subQ Provera 104 Qu le debo informar a mi profesional de la salud antes de tomar este medicamento? Necesita saber si usted presenta alguno de los siguientes problemas o situaciones: -si consume alcohol con frecuencia -asma -enfermedad vascular o antecedente de cogulos sanguneos en los pulmones o las piernas -enfermedad de los [place], como osteoporosis -diabetes -trastornos de Youth worker (anorexia nerviosa o bulimia) -alta presin sangunea -infecciones por VIH o SIDA -enfermedad renal -enfermedad heptica -depresin mental -migraa -convulsiones -derrame cerebral -fuma tabaco -sangrado vaginal -una reaccin alrgica o inusual a la medroxiprogesterona, otras hormonas, otros medicamentos, alimentos, colorantes o conservantes -si est embarazada o buscando quedar embarazada -si est amamantando a un beb Cmo debo utilizar este medicamento? El anticonceptivo de Depo-Provera se inyecta por va intramuscular. La Depo-SubQ Provera 104 se inyecta por va subcutnea. Las [employer] un profesional de Technical sales engineer. Usted no puede estar embarazada antes de recibir una inyeccin. La inyeccin normalmente se aplica durante los primeros 5 das despus de comenzar un perodo menstrual o 6 semanas despus de un parto. Hable con su pediatra para informarse acerca del uso de este medicamento en nios. Puede requerir atencin especial. Estas inyecciones han sido usadas en nias que han empezados a  tener perodos [place]. Sobredosis: Pngase en contacto inmediatamente con un centro toxicolgico o una sala de urgencia si usted cree que haya tomado demasiado medicamento. ATENCIN: [employer] es solo para usted. No comparta este medicamento con nadie. Qu sucede si me olvido de una dosis? Trate de no olvidar ninguna dosis. Para mantener el control de natalidad necesitar una inyeccin cada 3 meses. Si no puede asistir a una cita, comunquese con su profesional de la salud para que se la [place]. Si espera ms de 13 semanas entre las inyecciones anticonceptivos de Depo-Provera o ms de 14 semanas entre inyecciones anticonceptivos de Depo-subQ Provera 104, puede quedarse [place]. Si no puede asistir a su cita utilice otro mtodo anticonceptivo. Tal vez deba hacerse una prueba de embarazo antes de recibir otra inyeccin. Qu puede interactuar con este medicamento? No tome esta medicina con ninguno de los siguientes medicamentos: -bosentano Esta medicina tambin puede interactuar con los siguientes medicamentos: -aminoglutethimide -antibiticos o medicamentos para infecciones, especialmente rifampicina, rifabutina, rifapentina y griseofulvina -aprepitant -barbitricos, tales como el fenobarbital o primidona -bexaroteno -carbamazepina -medicamentos para convulsiones, tales como etotona, felbamato, [place], [place], topiramato -modafinilo -hierba de [place] Puede ser que esta lista no menciona todas las posibles interacciones. Informe a su profesional de [employer]	[place] de [employer] productos a base de hierbas, medicamentos de [place] o suplementos nutritivos que est tomando. Si usted fuma, consume bebidas alcohlicas o si utiliza drogas ilegales, indqueselo tambin a su profesional de [employer]	[place]. Algunas sustancias pueden interactuar con su medicamento. A qu debo estar atento al usar [employer]? Este medicamento no la protege de la infeccin por VIH (SIDA) ni de otras  enfermedades de transmisin sexual. El uso de este producto puede provocar una prdida de calcio de sus huesos. La prdida de calcio puede provocar huesos dbiles (osteoporosis). Slo use este producto durante ms de 2 aos si otras formas de anticonceptivos no  son apropiados para usted. Mientre ms tiempo use este producto para el control de la natalidad, tendr ms riesgo de Insurance risk surveyor de [employer]. Consulte a su profesional de Pharmacist, hospital de cmo puede [employer] fuertes. Puede experimentar un cambio en el patrn de sangrado o periodos irregulares. Muchas mujeres dejan de tener periodos [employer]. Si recibe sus inyecciones a tiempo, la posibilidad de quedarse embarazada es muy baja. Si cree que podr [employer], visite a su profesional de la salud lo antes posible. Si desea quedar embarazada dentro del prximo ao, informe a su profesional de [employer]	[place]. [place] de este medicamento puede perdurar durante mucho tiempo despus de recibir su ltima inyeccin. Qu efectos secundarios puedo tener al [employer] este medicamento? Efectos secundarios que debe informar a su mdico o a Barrister's clerk de la salud tan pronto como sea posible: -Chief of Staff como erupcin cutnea, picazn o urticarias, hinchazn de la cara, labios o lengua -secreciones o sensibilidad de las mamas -problemas respiratorios -cambios en la visin -depresin -sensacin de desmayos o mareos, cadas -fiebre -dolor en el abdomen, pecho, entrepierna o piernas -problemas de coordinacin, del habla, al caminar -cansancio o debilidad inusual -color amarillento de los ojos o la piel Efectos secundarios que, por lo general, no requieren Geophysical data processor (debe informarlos a mdico o a Barrister's clerk de la salud si persisten o si son molestos): -cne -retencin de lquidos e hinchazn -dolor de cabeza -perodos menstruales irregulares, manchando o ausencia de perodos menstruales -dolor,  picazn o reaccin cutnea temporal en el lugar de la inyeccin -aumento de peso Puede ser que esta lista no menciona todos los posibles efectos secundarios. Comunquese a su mdico por asesoramiento mdico [employer]. Usted puede informar los efectos secundarios a la FDA por telfono al 1-800-FDA-1088. Dnde debo guardar mi medicina? No se aplica en este caso. Un profesional de Probation officer las inyecciones. ATENCIN: Este folleto es un resumen. Puede ser que no cubra toda la posible informacin. Si usted tiene preguntas acerca de esta medicina, consulte con su mdico, su farmacutico o su profesional de Technical sales engineer.  2015, Elsevier/Gold Standard. (2008-05-01 15:09:00)   Informacin sobre el dispositivo intrauterino (Intrauterine Device Information) Un dispositivo intrauterino (DIU) se inserta en el tero e impide el embarazo. Hay dos tipos de DIU:   DIU de cobre: este tipo de DIU est recubierto con un alambre de cobre y se inserta dentro del tero. El cobre hace que el tero y las trompas de [place] produzcan un liquido que [employer] espermatozoides. El DIU de cobre puede Nutritional therapist durante 10 aos.  DIU con hormona: este tipo de DIU contiene la hormona progestina (progesterona sinttica). Las hormonas hacen que el moco cervical se haga ms espeso, lo que evita que el esperma ingrese al tero. Tambin hace que la membrana que recubre internamente al tero sea ms delgada lo que impide el implante del vulo fertilizado. La hormona debilita o destruye los espermatozoides que ingresan al tero. Alguno de los tipos de DIU hormonal pueden Nutritional therapist durante 5 aos y otros tipos pueden dejarse en el lugar por 3 aos. El mdico se asegurar de que usted sea una buena candidata para usar el DIU. Converse con su mdico acerca de los posibles efectos secundarios.  VENTAJAS DEL [place] es muy eficaz, reversible, de accin  prolongada y de bajo mantenimiento.  No hay efectos secundarios relacionados con el estrgeno.  El DIU  puede ser [employer].  No est asociado con el aumento de [place].  Funciona inmediatamente despus de la insercin.  El DIU hormonal funciona inmediatamente si se inserta dentro de los 7 das del inicio del perodo. Ser necesario que utilice un mtodo anticonceptivo adicional durante 7 das si el DIU hormonal se inserta en algn otro momento del ciclo.  El DIU de cobre no interfiere con las hormonas femeninas.  El DIU hormonal puede hacer que los perodos menstruales abundantes se hagan ms ligeros y que haya menos clicos.  El DIU hormonal puede usarse durante 3 a 5 aos.  El DIU de cobre puede usarse durante 10 aos. DESVENTAJAS DEL DISPOSITIVO INTRAUTERINO  El DIU hormonal puede estar asociado con patrones de sangrado irregular.  El DIU de cobre puede hacer que el flujo menstrual ms abundante y doloroso.  Puede experimentar clicos y sangrado vaginal despus de la insercin. Document Released: 08/07/2009 Document Revised: 10/20/2012 [facility] Patient Information 2015 [place]. This information is not intended to replace advice given to you by your health care provider. Make sure you discuss any questions you have with your health care provider.

## 2014-01-12 ENCOUNTER — Other Ambulatory Visit: Payer: Self-pay | Admitting: *Deleted

## 2014-01-12 DIAGNOSIS — C50411 Malignant neoplasm of upper-outer quadrant of right female breast: Secondary | ICD-10-CM

## 2014-01-13 ENCOUNTER — Other Ambulatory Visit (HOSPITAL_BASED_OUTPATIENT_CLINIC_OR_DEPARTMENT_OTHER): Payer: Self-pay

## 2014-01-13 ENCOUNTER — Ambulatory Visit (HOSPITAL_BASED_OUTPATIENT_CLINIC_OR_DEPARTMENT_OTHER): Payer: Self-pay | Admitting: Hematology and Oncology

## 2014-01-13 VITALS — BP 135/65 | HR 55 | Temp 98.8°F | Resp 18 | Ht 59.0 in | Wt 159.6 lb

## 2014-01-13 DIAGNOSIS — Z170 Estrogen receptor positive status [ER+]: Secondary | ICD-10-CM

## 2014-01-13 DIAGNOSIS — D500 Iron deficiency anemia secondary to blood loss (chronic): Secondary | ICD-10-CM

## 2014-01-13 DIAGNOSIS — C50411 Malignant neoplasm of upper-outer quadrant of right female breast: Secondary | ICD-10-CM

## 2014-01-13 LAB — COMPREHENSIVE METABOLIC PANEL (CC13)
ALBUMIN: 4.1 g/dL (ref 3.5–5.0)
ALK PHOS: 80 U/L (ref 40–150)
ALT: 25 U/L (ref 0–55)
AST: 24 U/L (ref 5–34)
Anion Gap: 8 mEq/L (ref 3–11)
BUN: 5.8 mg/dL — AB (ref 7.0–26.0)
CO2: 26 mEq/L (ref 22–29)
CREATININE: 0.7 mg/dL (ref 0.6–1.1)
Calcium: 9.6 mg/dL (ref 8.4–10.4)
Chloride: 106 mEq/L (ref 98–109)
GLUCOSE: 107 mg/dL (ref 70–140)
POTASSIUM: 3.5 meq/L (ref 3.5–5.1)
Sodium: 140 mEq/L (ref 136–145)
Total Bilirubin: 0.44 mg/dL (ref 0.20–1.20)
Total Protein: 7.3 g/dL (ref 6.4–8.3)

## 2014-01-13 LAB — CBC WITH DIFFERENTIAL/PLATELET
BASO%: 0.5 % (ref 0.0–2.0)
BASOS ABS: 0 10*3/uL (ref 0.0–0.1)
EOS ABS: 0 10*3/uL (ref 0.0–0.5)
EOS%: 0.6 % (ref 0.0–7.0)
HEMATOCRIT: 38.8 % (ref 34.8–46.6)
HEMOGLOBIN: 11.7 g/dL (ref 11.6–15.9)
LYMPH%: 27.9 % (ref 14.0–49.7)
MCH: 24.1 pg — AB (ref 25.1–34.0)
MCHC: 30.2 g/dL — ABNORMAL LOW (ref 31.5–36.0)
MCV: 79.7 fL (ref 79.5–101.0)
MONO#: 0.4 10*3/uL (ref 0.1–0.9)
MONO%: 6.8 % (ref 0.0–14.0)
NEUT%: 64.2 % (ref 38.4–76.8)
NEUTROS ABS: 3.3 10*3/uL (ref 1.5–6.5)
PLATELETS: 175 10*3/uL (ref 145–400)
RBC: 4.87 10*6/uL (ref 3.70–5.45)
RDW: 29.4 % — AB (ref 11.2–14.5)
WBC: 5.2 10*3/uL (ref 3.9–10.3)
lymph#: 1.4 10*3/uL (ref 0.9–3.3)

## 2014-01-13 NOTE — Progress Notes (Signed)
Patient Care Team: Minerva Ends, MD as PCP - General (Family Medicine) Alphonsa Overall, MD as Consulting Physician (General Surgery) Rulon Eisenmenger, MD as Consulting Physician (Hematology and Oncology) Daneil Dolin, MD as Consulting Physician (Gastroenterology)  DIAGNOSIS: Breast cancer of upper-outer quadrant of right female breast   Staging form: Breast, AJCC 7th Edition     Clinical: Stage IIA (T2, N0, cM0) - Unsigned       Staging comments: Staged at breast conference 11/23/13.      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 She was also severely anemic and was found to have heavy menstrual bleeding and gynecology evaluation suggested that she may have  Fibroids.she received iron therapy which improved her hemoglobin significantly. She is here today to discuss her decision regarding treatment plan.  REVIEW OF SYSTEMS:   Constitutional: Denies fevers, chills or abnormal weight loss Eyes: Denies blurriness of vision Ears, nose, mouth, throat, and face: Denies mucositis or sore throat Respiratory: Denies cough, dyspnea or  wheezes Cardiovascular: Denies palpitation, chest discomfort or lower extremity swelling Gastrointestinal:  Denies nausea, heartburn or change in bowel habits Skin: Denies abnormal skin rashes Lymphatics: Denies new lymphadenopathy or easy bruising Neurological:Denies numbness, tingling or new weaknesses Behavioral/Psych: Mood is stable, no new changes  Breast: lump in the breast All other systems were reviewed with the patient and are negative.  I have reviewed the past medical history, past surgical history, social history and family history with the patient and they are unchanged from previous note.  ALLERGIES:  has No Known Allergies.  MEDICATIONS:  No current outpatient prescriptions on file.   No current facility-administered medications for this visit.    PHYSICAL EXAMINATION: ECOG PERFORMANCE STATUS: 0 - Asymptomatic  Filed Vitals:   01/13/14 1236  BP: 135/65  Pulse: 55  Temp: 98.8 F (37.1 C)  Resp: 18   Filed Weights   01/13/14 1236  Weight: 159 lb 9.6 oz (72.394 kg)    GENERAL:alert, no distress and comfortable SKIN: skin color, texture, turgor are normal, no rashes or significant lesions EYES: normal, Conjunctiva are pink and non-injected, sclera clear OROPHARYNX:no exudate, no erythema and lips, buccal mucosa, and tongue normal  NECK: supple, thyroid normal size, non-tender, without nodularity LYMPH:  no palpable lymphadenopathy in the cervical, axillary or inguinal LUNGS: clear to auscultation and percussion with normal breathing effort HEART: regular rate & rhythm and no murmurs and no lower extremity edema ABDOMEN:abdomen soft, non-tender and normal bowel sounds Musculoskeletal:no cyanosis of digits and no clubbing  NEURO: alert & oriented x 3 with fluent speech, no focal motor/sensory deficits  LABORATORY DATA:  I have reviewed the data  as listed   Chemistry      Component Value Date/Time   NA 140 01/13/2014 1206   NA 139 07/05/2009 1337   K 3.5  01/13/2014 1206   K 3.6 07/05/2009 1337   CO2 26 01/13/2014 1206   BUN 5.8* 01/13/2014 1206   CREATININE 0.7 01/13/2014 1206      Component Value Date/Time   CALCIUM 9.6 01/13/2014 1206   ALKPHOS 80 01/13/2014 1206   AST 24 01/13/2014 1206   ALT 25 01/13/2014 1206   BILITOT 0.44 01/13/2014 1206       Lab Results  Component Value Date   WBC 5.2 01/13/2014   HGB 11.7 01/13/2014   HCT 38.8 01/13/2014   MCV 79.7 01/13/2014   PLT 175 01/13/2014   NEUTROABS 3.3 01/13/2014     RADIOGRAPHIC STUDIES: I have personally reviewed the radiology reports and agreed with their findings. No results found.   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 clinical staging ER PR 0% HER-2 negative ratio 1.3 Ki-67 77% Patient return back to discuss and finalize a treatment plan. She does not want to undergo chemotherapy or surgery. She has met with the [name] who plans to do a natural therapy with something called "sole" along with baking soda and that's what she wants to do I discussed with her that doing unconventional treatments, could jeopardize her life and could lose her chance for cure of this curable breast cancer. I also discussed with her that I would not be able to continue to manage her breast cancer if she received such treatment elsewhere.  I instructed her that if her situation gets worse, and she would like [place] to take over her care, to call us to be seen at the cancer center. I discussed this with her the help of the interpreter. She wanted to know if I can order periodic mammograms. I discussed with her that since it would not be actively managing her care, I would not like to order those tests.  Anemia due to heavy menstrual bleeding: Patient following with gynecology. She will follow with her primary care physician for her blood issues. Today's  hemoglobin is 11.4 daily has improved from 6.7. She was recommended to get Depo-Provera but she is also taking some natural medication for this.  No followup to be scheduled at this time.   No orders of the defined types were placed in this encounter.   The patient has a good understanding of the overall plan. she agrees with it. She will call with any problems that may develop before her next visit here.  I spent 25 minutes counseling the patient face to face. The total time spent in the appointment was 30 minutes and more than 50% was on counseling and review of test results    Rulon Eisenmenger, MD 01/13/2014 1:09 PM

## 2014-01-13 NOTE — Assessment & Plan Note (Signed)
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 clinical staging ER PR 0% HER-2 negative ratio 1.3 Ki-67 77% Patient return back to discuss and finalize a treatment plan. She does not want to undergo chemotherapy or surgery. She has met with the [name] who plans to do a natural therapy with something called "sole" along with baking soda and that's what she wants to do I discussed with her that doing unconventional treatments, could jeopardize her life and could lose her chance for cure of this curable breast cancer. I also discussed with her that I would not be able to continue to manage her breast cancer if she received such treatment elsewhere.  I instructed her that if her situation gets worse, and she would like [place] to take over her care, to call us to be seen at the cancer center. I discussed this with her the help of the interpreter. She wanted to know if I can order periodic mammograms. I discussed with her that since it would not be actively managing her care, I would not like to order those tests.  Anemia due to heavy menstrual bleeding: Patient following with gynecology. She will follow with her primary care physician for her blood issues. Today's hemoglobin is 11.4 daily has improved from 6.7. She was recommended to get Depo-Provera but she is also taking some natural medication for this.  No followup to be scheduled at this time.

## 2014-01-17 ENCOUNTER — Telehealth: Payer: Self-pay

## 2014-01-17 ENCOUNTER — Telehealth: Payer: Self-pay | Admitting: Emergency Medicine

## 2014-01-17 ENCOUNTER — Telehealth: Payer: Self-pay | Admitting: Family Medicine

## 2014-01-17 NOTE — Telephone Encounter (Signed)
Returned pt call today in regards to medical question from the nurse. Pt states she was seen by GYN with diagnosis Fibroids which is causing Iron anemia def Pt wanted Dr. Adrian Blackwater to know she will decline Colonoscopy at this time Pt also refused chemo treatment for Breast Ca stage 2, already discussed with Oncologist In house [place] present

## 2014-01-17 NOTE — Telephone Encounter (Signed)
Called to cancel her TCS/EGD. She is not going to have it done. I talked with husband to verify since she does not speak english.

## 2014-01-17 NOTE — Telephone Encounter (Signed)
Pt. Called to request to speak to her PCP's nurse. Please f/u with pt

## 2014-01-17 NOTE — Telephone Encounter (Signed)
           Pt. Called to request to speak to her PCP's nurse. Please f/u with pt

## 2014-01-18 NOTE — Telephone Encounter (Signed)
Noted. I reviewed her chart. She has also declined conventional treatment of her breast cancer.

## 2014-01-20 NOTE — Telephone Encounter (Signed)
Noted  

## 2014-01-24 ENCOUNTER — Ambulatory Visit (HOSPITAL_COMMUNITY): Admission: RE | Admit: 2014-01-24 | Payer: Self-pay | Source: Ambulatory Visit | Admitting: Internal Medicine

## 2014-01-24 ENCOUNTER — Encounter (HOSPITAL_COMMUNITY): Admission: RE | Payer: Self-pay | Source: Ambulatory Visit

## 2014-01-24 SURGERY — COLONOSCOPY
Anesthesia: Moderate Sedation

## 2014-05-03 ENCOUNTER — Ambulatory Visit: Payer: Self-pay

## 2015-01-15 ENCOUNTER — Encounter: Payer: Self-pay | Admitting: Genetic Counselor

## 2015-01-15 DIAGNOSIS — Z1379 Encounter for other screening for genetic and chromosomal anomalies: Secondary | ICD-10-CM | POA: Insufficient documentation

## 2015-05-07 ENCOUNTER — Telehealth: Payer: Self-pay | Admitting: Hematology and Oncology

## 2015-05-07 NOTE — Telephone Encounter (Signed)
Patient called to schedule appt with Gr. Gudena. Pt saw MD in 2015 but wanted to use other methods to treat dx first. Now wants to speak with MD about her options

## 2015-05-07 NOTE — Telephone Encounter (Signed)
pt called to r/s appt...done....pt ok and aware of new d.t °

## 2015-05-10 ENCOUNTER — Other Ambulatory Visit: Payer: Self-pay | Admitting: *Deleted

## 2015-05-10 ENCOUNTER — Encounter (HOSPITAL_COMMUNITY): Payer: Self-pay | Admitting: Emergency Medicine

## 2015-05-10 ENCOUNTER — Telehealth: Payer: Self-pay | Admitting: *Deleted

## 2015-05-10 ENCOUNTER — Emergency Department (HOSPITAL_COMMUNITY): Payer: Medicaid Other

## 2015-05-10 ENCOUNTER — Inpatient Hospital Stay (HOSPITAL_COMMUNITY)
Admission: EM | Admit: 2015-05-10 | Discharge: 2015-05-12 | DRG: 841 | Disposition: A | Discharge status: Hospice | Payer: Medicaid Other | Attending: Internal Medicine | Admitting: Internal Medicine

## 2015-05-10 ENCOUNTER — Telehealth: Payer: Self-pay | Admitting: Hematology and Oncology

## 2015-05-10 DIAGNOSIS — C799 Secondary malignant neoplasm of unspecified site: Secondary | ICD-10-CM | POA: Diagnosis present

## 2015-05-10 DIAGNOSIS — Z803 Family history of malignant neoplasm of breast: Secondary | ICD-10-CM

## 2015-05-10 DIAGNOSIS — C787 Secondary malignant neoplasm of liver and intrahepatic bile duct: Secondary | ICD-10-CM | POA: Diagnosis present

## 2015-05-10 DIAGNOSIS — Z66 Do not resuscitate: Secondary | ICD-10-CM | POA: Diagnosis present

## 2015-05-10 DIAGNOSIS — R110 Nausea: Secondary | ICD-10-CM | POA: Diagnosis present

## 2015-05-10 DIAGNOSIS — Z8249 Family history of ischemic heart disease and other diseases of the circulatory system: Secondary | ICD-10-CM

## 2015-05-10 DIAGNOSIS — Z806 Family history of leukemia: Secondary | ICD-10-CM

## 2015-05-10 DIAGNOSIS — Z833 Family history of diabetes mellitus: Secondary | ICD-10-CM | POA: Diagnosis not present

## 2015-05-10 DIAGNOSIS — C7800 Secondary malignant neoplasm of unspecified lung: Secondary | ICD-10-CM | POA: Diagnosis present

## 2015-05-10 DIAGNOSIS — R1011 Right upper quadrant pain: Secondary | ICD-10-CM | POA: Diagnosis present

## 2015-05-10 DIAGNOSIS — R7989 Other specified abnormal findings of blood chemistry: Secondary | ICD-10-CM | POA: Diagnosis present

## 2015-05-10 DIAGNOSIS — D649 Anemia, unspecified: Secondary | ICD-10-CM | POA: Diagnosis present

## 2015-05-10 DIAGNOSIS — C781 Secondary malignant neoplasm of mediastinum: Secondary | ICD-10-CM | POA: Diagnosis present

## 2015-05-10 DIAGNOSIS — C50411 Malignant neoplasm of upper-outer quadrant of right female breast: Secondary | ICD-10-CM | POA: Diagnosis present

## 2015-05-10 DIAGNOSIS — Z515 Encounter for palliative care: Secondary | ICD-10-CM | POA: Insufficient documentation

## 2015-05-10 DIAGNOSIS — C7951 Secondary malignant neoplasm of bone: Secondary | ICD-10-CM | POA: Diagnosis present

## 2015-05-10 DIAGNOSIS — C773 Secondary and unspecified malignant neoplasm of axilla and upper limb lymph nodes: Principal | ICD-10-CM | POA: Diagnosis present

## 2015-05-10 DIAGNOSIS — C50919 Malignant neoplasm of unspecified site of unspecified female breast: Secondary | ICD-10-CM | POA: Insufficient documentation

## 2015-05-10 DIAGNOSIS — R54 Age-related physical debility: Secondary | ICD-10-CM | POA: Diagnosis present

## 2015-05-10 DIAGNOSIS — R1084 Generalized abdominal pain: Secondary | ICD-10-CM | POA: Insufficient documentation

## 2015-05-10 DIAGNOSIS — C801 Malignant (primary) neoplasm, unspecified: Secondary | ICD-10-CM

## 2015-05-10 DIAGNOSIS — Z7189 Other specified counseling: Secondary | ICD-10-CM | POA: Insufficient documentation

## 2015-05-10 LAB — COMPREHENSIVE METABOLIC PANEL
ALK PHOS: 376 U/L — AB (ref 38–126)
ALT: 83 U/L — AB (ref 14–54)
AST: 239 U/L — ABNORMAL HIGH (ref 15–41)
Albumin: 2.3 g/dL — ABNORMAL LOW (ref 3.5–5.0)
Anion gap: 20 — ABNORMAL HIGH (ref 5–15)
BILIRUBIN TOTAL: 1.3 mg/dL — AB (ref 0.3–1.2)
BUN: 23 mg/dL — ABNORMAL HIGH (ref 6–20)
CALCIUM: 9.1 mg/dL (ref 8.9–10.3)
CO2: 19 mmol/L — AB (ref 22–32)
CREATININE: 0.47 mg/dL (ref 0.44–1.00)
Chloride: 97 mmol/L — ABNORMAL LOW (ref 101–111)
Glucose, Bld: 110 mg/dL — ABNORMAL HIGH (ref 65–99)
Potassium: 4.3 mmol/L (ref 3.5–5.1)
Sodium: 136 mmol/L (ref 135–145)
Total Protein: 6.3 g/dL — ABNORMAL LOW (ref 6.5–8.1)

## 2015-05-10 LAB — URINALYSIS, ROUTINE W REFLEX MICROSCOPIC
GLUCOSE, UA: NEGATIVE mg/dL
Hgb urine dipstick: NEGATIVE
KETONES UR: NEGATIVE mg/dL
LEUKOCYTES UA: NEGATIVE
NITRITE: NEGATIVE
PH: 5.5 (ref 5.0–8.0)
Protein, ur: NEGATIVE mg/dL
SPECIFIC GRAVITY, URINE: 1.022 (ref 1.005–1.030)

## 2015-05-10 LAB — RETICULOCYTES
RBC.: 2.26 MIL/uL — ABNORMAL LOW (ref 3.87–5.11)
Retic Count, Absolute: 63.3 10*3/uL (ref 19.0–186.0)
Retic Ct Pct: 2.8 % (ref 0.4–3.1)

## 2015-05-10 LAB — FERRITIN: Ferritin: 268 ng/mL (ref 11–307)

## 2015-05-10 LAB — CBC
HCT: 16 % — ABNORMAL LOW (ref 36.0–46.0)
Hemoglobin: 4.4 g/dL — CL (ref 12.0–15.0)
MCH: 19.2 pg — ABNORMAL LOW (ref 26.0–34.0)
MCHC: 27.5 g/dL — AB (ref 30.0–36.0)
MCV: 69.9 fL — AB (ref 78.0–100.0)
Platelets: 146 10*3/uL — ABNORMAL LOW (ref 150–400)
RBC: 2.29 MIL/uL — AB (ref 3.87–5.11)
RDW: 25.5 % — AB (ref 11.5–15.5)
WBC: 10.8 10*3/uL — AB (ref 4.0–10.5)

## 2015-05-10 LAB — LIPASE, BLOOD: Lipase: 18 U/L (ref 11–51)

## 2015-05-10 LAB — IRON AND TIBC
IRON: 39 ug/dL (ref 28–170)
SATURATION RATIOS: 15 % (ref 10.4–31.8)
TIBC: 255 ug/dL (ref 250–450)
UIBC: 216 ug/dL

## 2015-05-10 LAB — VITAMIN B12: VITAMIN B 12: 1285 pg/mL — AB (ref 180–914)

## 2015-05-10 LAB — PROTIME-INR
INR: 1.54 — AB (ref 0.00–1.49)
Prothrombin Time: 18 seconds — ABNORMAL HIGH (ref 11.6–15.2)

## 2015-05-10 LAB — FOLATE: Folate: 6.2 ng/mL (ref >5.9)

## 2015-05-10 MED ORDER — FENTANYL CITRATE (PF) 100 MCG/2ML IJ SOLN
50.0000 ug | Freq: Once | INTRAMUSCULAR | Status: AC
  Administered 2015-05-10: 50 ug via INTRAVENOUS
  Filled 2015-05-10: qty 2

## 2015-05-10 MED ORDER — HYDROMORPHONE HCL 1 MG/ML IJ SOLN
1.0000 mg | INTRAMUSCULAR | Status: DC | PRN
  Administered 2015-05-10: 1 mg via INTRAVENOUS
  Filled 2015-05-10: qty 1

## 2015-05-10 MED ORDER — SODIUM CHLORIDE 0.9 % IV SOLN
1.0000 mg/h | INTRAVENOUS | Status: DC
  Administered 2015-05-10 – 2015-05-12 (×2): 1 mg/h via INTRAVENOUS
  Filled 2015-05-10 (×2): qty 10

## 2015-05-10 MED ORDER — MORPHINE BOLUS VIA INFUSION
1.0000 mg | INTRAVENOUS | Status: DC | PRN
  Administered 2015-05-11 – 2015-05-12 (×4): 2 mg via INTRAVENOUS
  Filled 2015-05-10 (×5): qty 4

## 2015-05-10 MED ORDER — IOHEXOL 300 MG/ML  SOLN
80.0000 mL | Freq: Once | INTRAMUSCULAR | Status: AC | PRN
  Administered 2015-05-10: 80 mL via INTRAVENOUS

## 2015-05-10 MED ORDER — ONDANSETRON HCL 4 MG/2ML IJ SOLN
4.0000 mg | Freq: Once | INTRAMUSCULAR | Status: AC
  Administered 2015-05-10: 4 mg via INTRAVENOUS
  Filled 2015-05-10: qty 2

## 2015-05-10 NOTE — ED Provider Notes (Signed)
CSN: [id number]     Arrival date & time 05/10/15  1500 History   First MD Initiated Contact with Patient 05/10/15 1533     Chief Complaint  Patient presents with  . Abdominal Pain     The history is provided by the patient and a relative. No language interpreter was used.   Jennifer Raymond is a 50 y.o. female who presents to the Emergency Department complaining of abdominal pain.  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 She decided to get holistic therapy instead of surgery, chemotherapy, radiation. She has been treating the breast tumor with mud dressings.  She has developed bilateral arm and leg pain for the last 3 months and for the last 3 days she has had significant right upper quadrant pain. She denies fevers, chest pain, shortness of breath, vomiting, diarrhea. She presents today because she wants to consider her treatment options such as surgery. She still does not want chemotherapy or radiation therapy. 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  Past Medical History  Diagnosis Date  . Anemia   . Hot flashes   . Malignant neoplasm of breast (female), unspecified site   . Family history of malignant neoplasm of breast    Past Surgical History  Procedure Laterality Date  . Cesarean section      2 previous  . Wrist surgery due to fracture    . Tubal ligation     Family History  Problem Relation Age of Onset  . Diabetes Mother   . Hypertension Mother   . Breast cancer Maternal Grandmother     age dx?; deceased 90  . Leukemia Paternal Aunt     currently 42s  . Colon cancer Neg Hx    Social History  Substance Use Topics  . Smoking status: Never Smoker   . Smokeless tobacco: Never Used  . Alcohol Use: No   OB History    Gravida Para Term Preterm AB TAB SAB Ectopic Multiple Living   2 2 2       2      Review of Systems  All other systems reviewed and are negative.     Allergies  Other  Home Medications    Prior to Admission medications   Not on File   BP 130/56 mmHg  Pulse 135  Temp(Src) 99.6 F (37.6 C) (Oral)  Resp 20  Ht 4\' 11"  (1.499 m)  Wt 117 lb 1 oz (53.1 kg)  BMI 23.63 kg/m2  SpO2 84%  LMP 12/20/2013 (Exact Date) Physical Exam  Constitutional: She is oriented to person, place, and time. She appears well-developed.  cachectic  HENT:  Head: Normocephalic and atraumatic.  Cardiovascular: Regular rhythm.   No murmur heard. tachycardic  Pulmonary/Chest: Effort normal and breath sounds normal. No respiratory distress.  Large fungating mass to the right breast with foul-smelling exudate.  Abdominal:  Moderate diffuse abdominal tenderness with voluntary guarding, no rebound  Musculoskeletal:  3+ pitting edema of the bilateral lower extremities.  Neurological: She is alert and oriented to person, place, and time.  Skin: Skin is warm and dry.  Pale skin with icterus.  Psychiatric: She has a normal mood and affect. Her behavior is normal.  Nursing note and vitals reviewed.   ED Course  Procedures (including critical care time) Labs Review Labs Reviewed  COMPREHENSIVE METABOLIC PANEL - Abnormal; Notable for the following:    Chloride 97 (*)  CO2 19 (*)    Glucose, Bld 110 (*)    BUN 23 (*)    Total Protein 6.3 (*)    Albumin 2.3 (*)    AST 239 (*)    ALT 83 (*)    Alkaline Phosphatase 376 (*)    Total Bilirubin 1.3 (*)    Anion gap 20 (*)    All other components within normal limits  CBC - Abnormal; Notable for the following:    WBC 10.8 (*)    RBC 2.29 (*)    Hemoglobin 4.4 (*)    HCT 16.0 (*)    MCV 69.9 (*)    MCH 19.2 (*)    MCHC 27.5 (*)    RDW 25.5 (*)    Platelets 146 (*)    All other components within normal limits  URINALYSIS, ROUTINE W REFLEX MICROSCOPIC (NOT AT [facility]) - Abnormal; Notable for the following:    Color, Urine AMBER (*)    APPearance CLOUDY (*)    Bilirubin Urine SMALL (*)    All other components within normal limits   PROTIME-INR - Abnormal; Notable for the following:    Prothrombin Time 18.0 (*)    INR 1.54 (*)    All other components within normal limits  VITAMIN B12 - Abnormal; Notable for the following:    Vitamin B-12 1285 (*)    All other components within normal limits  RETICULOCYTES - Abnormal; Notable for the following:    RBC. 2.26 (*)    All other components within normal limits  LIPASE, BLOOD  FOLATE  IRON AND TIBC  FERRITIN    Imaging Review Ct Head W Wo Contrast  05/10/2015  CLINICAL DATA:  Breast cancer. EXAM: CT HEAD WITHOUT AND WITH CONTRAST TECHNIQUE: Contiguous axial images were obtained from the base of the skull through the vertex without and with intravenous contrast CONTRAST:  44mL OMNIPAQUE IOHEXOL 300 MG/ML  SOLN COMPARISON:  None. FINDINGS: No evidence for acute infarction, hemorrhage, mass lesion, hydrocephalus, or extra-axial fluid. Normal for age cerebral volume. No significant white matter disease. Post infusion, no abnormal enhancement of the brain or visible meninges is seen. There are tiny hypodensities in the calvarium, too small to characterize, without clear destruction of the outer or inner table of the skull, favored to represent venous lakes. No abnormality of the clivus or skull base. Negative orbits. No sinus or mastoid fluid. IMPRESSION: No acute or focal intracranial abnormality. No visible intracranial metastatic disease. Electronically Signed   By: Staci Righter M.D.   On: 05/10/2015 19:22   Ct Chest W Contrast  05/10/2015  CLINICAL DATA:  Breast cancer EXAM: CT CHEST, ABDOMEN, AND PELVIS WITH CONTRAST TECHNIQUE: Multidetector CT imaging of the chest, abdomen and pelvis was performed following the standard protocol during bolus administration of intravenous contrast. CONTRAST:  9mL OMNIPAQUE IOHEXOL 300 MG/ML  SOLN COMPARISON:  Breast MRI dated 11/22/2013. FINDINGS: CT CHEST FINDINGS Mediastinum/Lymph Nodes: Numerous enlarged lymph nodes are seen within the  mediastinum, largest of which is in the right lower paratracheal region measuring 3.3 x 1.7 cm. Numerous enlarged lymph nodes are seen within the right axilla and supraclavicular regions. Additional retropectoral lymph nodes identified bilaterally. Lungs/Pleura: Innumerable pulmonary nodules/masses and pleural based nodules/masses throughout both lungs indicating diffuse metastatic disease. Consolidations at each lung base are most likely atelectasis. Musculoskeletal: Diffuse osseous metastases throughout the thoracic spine. Associated pathologic compression fracture deformity noted at the T7 vertebral body level, approximately 20% compressed anteriorly. Additional osseous metastasis identified within  the right humeral head without definite associated fracture line. Large irregular mass is seen exophytic to the right breast, measuring at least 13 x 9 x 12 cm (AP by transverse by craniocaudal dimensions), with at least some necrotic areas centrally. CT ABDOMEN PELVIS FINDINGS Hepatobiliary: Liver is significantly enlarged and diffusely infiltrated with metastases. Largest metastasis is within the right liver lobe measuring 9.3 x 5 cm. Gallbladder is unremarkable. Pancreas: Pancreas not well seen, likely obscured by conglomerate lymphadenopathy in the upper abdomen, difficult to delineate. Spleen: Within normal limits in size and appearance. Adrenals/Urinary Tract: Adrenal glands appear grossly normal. Kidneys are unremarkable without mass, stone or hydronephrosis. Bladder appears normal. Stomach/Bowel: Bowel is normal in caliber. Vascular/Lymphatic: Probable conglomerate lymphadenopathy within the upper abdomen, difficult to delineate from the adjacent liver and pancreas. Reproductive: No mass or other significant abnormality. Other: No free fluid or abscess collections seen. No free intraperitoneal air. Musculoskeletal: Diffuse osseous metastases throughout the pelvis and thoracolumbar spine. Associated mild  compression fracture deformities, pathologic fractures, involving the L2 and L4 vertebral bodies. Ill-defined fluid/edema throughout the subcutaneous soft tissues indicating anasarca. IMPRESSION: 1. Massive right breast mass with at least some degree of central necrosis, measuring at least 13 x 9 x 12 cm, compatible with patient's known right breast cancer. 2. Innumerable pulmonary metastases throughout both lungs. Numerous metastatic lymph nodes within the mediastinum and right axilla. Diffuse hepatic metastases, largest within the right liver lobe measuring 9.3 x 5 cm. Probable conglomerate metastatic lymphadenopathy within the upper mediastinum, difficult to delineate from the liver and pancreas. Diffuse osseous metastases. 3. Pathologic compression fracture deformities within the thoracic and lumbar spine (T7, L2 and L4 vertebral bodies), each approximately 10-20% compressed. 4. Anasarca. These results were called by telephone at the time of interpretation on 05/10/2015 at 7:31 pm to Dr. Quintella Reichert , who verbally acknowledged these results. Electronically Signed   By: Franki Cabot M.D.   On: 05/10/2015 19:33   Ct Abdomen Pelvis W Contrast  05/10/2015  CLINICAL DATA:  Breast cancer EXAM: CT CHEST, ABDOMEN, AND PELVIS WITH CONTRAST TECHNIQUE: Multidetector CT imaging of the chest, abdomen and pelvis was performed following the standard protocol during bolus administration of intravenous contrast. CONTRAST:  36mL OMNIPAQUE IOHEXOL 300 MG/ML  SOLN COMPARISON:  Breast MRI dated 11/22/2013. FINDINGS: CT CHEST FINDINGS Mediastinum/Lymph Nodes: Numerous enlarged lymph nodes are seen within the mediastinum, largest of which is in the right lower paratracheal region measuring 3.3 x 1.7 cm. Numerous enlarged lymph nodes are seen within the right axilla and supraclavicular regions. Additional retropectoral lymph nodes identified bilaterally. Lungs/Pleura: Innumerable pulmonary nodules/masses and pleural based  nodules/masses throughout both lungs indicating diffuse metastatic disease. Consolidations at each lung base are most likely atelectasis. Musculoskeletal: Diffuse osseous metastases throughout the thoracic spine. Associated pathologic compression fracture deformity noted at the T7 vertebral body level, approximately 20% compressed anteriorly. Additional osseous metastasis identified within the right humeral head without definite associated fracture line. Large irregular mass is seen exophytic to the right breast, measuring at least 13 x 9 x 12 cm (AP by transverse by craniocaudal dimensions), with at least some necrotic areas centrally. CT ABDOMEN PELVIS FINDINGS Hepatobiliary: Liver is significantly enlarged and diffusely infiltrated with metastases. Largest metastasis is within the right liver lobe measuring 9.3 x 5 cm. Gallbladder is unremarkable. Pancreas: Pancreas not well seen, likely obscured by conglomerate lymphadenopathy in the upper abdomen, difficult to delineate. Spleen: Within normal limits in size and appearance. Adrenals/Urinary Tract: Adrenal glands appear grossly normal. Kidneys  are unremarkable without mass, stone or hydronephrosis. Bladder appears normal. Stomach/Bowel: Bowel is normal in caliber. Vascular/Lymphatic: Probable conglomerate lymphadenopathy within the upper abdomen, difficult to delineate from the adjacent liver and pancreas. Reproductive: No mass or other significant abnormality. Other: No free fluid or abscess collections seen. No free intraperitoneal air. Musculoskeletal: Diffuse osseous metastases throughout the pelvis and thoracolumbar spine. Associated mild compression fracture deformities, pathologic fractures, involving the L2 and L4 vertebral bodies. Ill-defined fluid/edema throughout the subcutaneous soft tissues indicating anasarca. IMPRESSION: 1. Massive right breast mass with at least some degree of central necrosis, measuring at least 13 x 9 x 12 cm, compatible with  patient's known right breast cancer. 2. Innumerable pulmonary metastases throughout both lungs. Numerous metastatic lymph nodes within the mediastinum and right axilla. Diffuse hepatic metastases, largest within the right liver lobe measuring 9.3 x 5 cm. Probable conglomerate metastatic lymphadenopathy within the upper mediastinum, difficult to delineate from the liver and pancreas. Diffuse osseous metastases. 3. Pathologic compression fracture deformities within the thoracic and lumbar spine (T7, L2 and L4 vertebral bodies), each approximately 10-20% compressed. 4. Anasarca. These results were called by telephone at the time of interpretation on 05/10/2015 at 7:31 pm to Dr. Quintella Reichert , who verbally acknowledged these results. Electronically Signed   By: Franki Cabot M.D.   On: 05/10/2015 19:33   I have personally reviewed and evaluated these images and lab results as part of my medical decision-making.   EKG Interpretation None      MDM   Final diagnoses:  Metastatic cancer [facility])    Patient with history of breast cancer here with abdominal pain, body aches. She is chronically ill appearing on examination. CBC with hemoglobin of 4.4. Patient declines transfusion. She is requesting evaluation to evaluate her treatment options. CT scan demonstrates extensive metastatic disease. Discussed with the patient and her family the extent of her illness and very limited treatment options. Patient is requesting palliative care/hospice care. Plan to admit for comfort measures.    Quintella Reichert, MD 05/11/15 0110

## 2015-05-10 NOTE — ED Notes (Signed)
Called [facility], attempted to give report.  Spoke with [place] & requested the RN call back.

## 2015-05-10 NOTE — H&P (Signed)
[facility] History and Physical  Jennifer Raymond WRU:[id number] DOB: 1965/05/16 DOA: 05/10/2015  Referring physician: EDP PCP: Minerva Ends, MD   Chief Complaint: Abdominal pain   HPI: Jennifer Raymond is a 50 y.o. female who presents to the ED with c/o abdominal pain.  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  She decided to get natural therapy with mud dressings and refused chemo, radiation, and surgery.  She devleoped B arm and leg pain for the past 3 months, and over the last 3 days has significant RUQ pain.  Initially today she was presenting to see if shed had any surgical treatment options still due to the breast mass becoming a fungating mass with foul smelling exudate.  After being informed that surgery would not be an option due to her anemia (she refuses blood transfusions due to being a Jehovah's witness), and the overall progression of tumor, patient expressed to EDP that she just wanted hospice at this point.  Review of Systems: Systems reviewed.  As above, otherwise negative  Past Medical History  Diagnosis Date  . Anemia   . Hot flashes   . Malignant neoplasm of breast (female), unspecified site   . Family history of malignant neoplasm of breast    Past Surgical History  Procedure Laterality Date  . Cesarean section      2 previous  . Wrist surgery due to fracture    . Tubal ligation     Social History:  reports that she has never smoked. She has never used smokeless tobacco. She reports that she does not drink alcohol or use illicit drugs.  No Known Allergies  Family History  Problem Relation Age of Onset  . Diabetes Mother   . Hypertension Mother   . Breast cancer Maternal Grandmother     age dx?; deceased 13  . Leukemia Paternal Aunt     currently 61s  . Colon cancer Neg Hx      Prior to Admission medications   Not on File   Physical Exam: Filed Vitals:   05/10/15 1528 05/10/15 1758  BP: 129/61 135/64   Pulse: 125 128  Temp: 99.1 F (37.3 C)   Resp: 20 18    BP 135/64 mmHg  Pulse 128  Temp(Src) 99.1 F (37.3 C)  Resp 18  SpO2 92%  LMP 12/20/2013 (Exact Date)  General Appearance:    Alert, oriented, no distress, appears stated age, Cachectic  Head:    Normocephalic, atraumatic  Eyes:    PERRL, EOMI, sclera non-icteric        Nose:   Nares without drainage or epistaxis. Mucosa, turbinates normal  Throat:   Moist mucous membranes. Oropharynx without erythema or exudate.  Neck:   Supple. No carotid bruits.  No thyromegaly.  No lymphadenopathy.   Back:     No CVA tenderness, no spinal tenderness  Lungs:     Clear to auscultation bilaterally, without wheezes, rhonchi or rales  Chest wall:    Large fungating mass to right breast.  Heart:    Regular rate and rhythm without murmurs, gallops, rubs  Abdomen:     Soft, non-tender, nondistended, normal bowel sounds, no organomegaly  Genitalia:    deferred  Rectal:    deferred  Extremities:   No clubbing, cyanosis, 3+ pitting edema BLE  Pulses:   2+ and symmetric all extremities  Skin:   Skin color, texture, turgor normal, no rashes or lesions  Lymph nodes:   Cervical, supraclavicular, and axillary nodes normal  Neurologic:   CNII-XII intact. Normal strength, sensation and reflexes      throughout    Labs on Admission:  Basic Metabolic Panel:  Recent Labs Lab 05/10/15 1613  NA 136  K 4.3  CL 97*  CO2 19*  GLUCOSE 110*  BUN 23*  CREATININE 0.47  CALCIUM 9.1   Liver Function Tests:  Recent Labs Lab 05/10/15 1613  AST 239*  ALT 83*  ALKPHOS 376*  BILITOT 1.3*  PROT 6.3*  ALBUMIN 2.3*    Recent Labs Lab 05/10/15 1613  LIPASE 18   No results for input(s): AMMONIA in the last 168 hours. CBC:  Recent Labs Lab 05/10/15 1613  WBC 10.8*  HGB 4.4*  HCT 16.0*  MCV 69.9*  PLT 146*   Cardiac Enzymes: No results for input(s): CKTOTAL, CKMB, CKMBINDEX, TROPONINI in the last 168 hours.  BNP (last 3 results) No  results for input(s): PROBNP in the last 8760 hours. CBG: No results for input(s): GLUCAP in the last 168 hours.  Radiological Exams on Admission: Ct Head W Wo Contrast  05/10/2015  CLINICAL DATA:  Breast cancer. EXAM: CT HEAD WITHOUT AND WITH CONTRAST TECHNIQUE: Contiguous axial images were obtained from the base of the skull through the vertex without and with intravenous contrast CONTRAST:  13m OMNIPAQUE IOHEXOL 300 MG/ML  SOLN COMPARISON:  None. FINDINGS: No evidence for acute infarction, hemorrhage, mass lesion, hydrocephalus, or extra-axial fluid. Normal for age cerebral volume. No significant white matter disease. Post infusion, no abnormal enhancement of the brain or visible meninges is seen. There are tiny hypodensities in the calvarium, too small to characterize, without clear destruction of the outer or inner table of the skull, favored to represent venous lakes. No abnormality of the clivus or skull base. Negative orbits. No sinus or mastoid fluid. IMPRESSION: No acute or focal intracranial abnormality. No visible intracranial metastatic disease. Electronically Signed   By: JStaci RighterM.D.   On: 05/10/2015 19:22   Ct Chest W Contrast  05/10/2015  CLINICAL DATA:  Breast cancer EXAM: CT CHEST, ABDOMEN, AND PELVIS WITH CONTRAST TECHNIQUE: Multidetector CT imaging of the chest, abdomen and pelvis was performed following the standard protocol during bolus administration of intravenous contrast. CONTRAST:  885mOMNIPAQUE IOHEXOL 300 MG/ML  SOLN COMPARISON:  Breast MRI dated 11/22/2013. FINDINGS: CT CHEST FINDINGS Mediastinum/Lymph Nodes: Numerous enlarged lymph nodes are seen within the mediastinum, largest of which is in the right lower paratracheal region measuring 3.3 x 1.7 cm. Numerous enlarged lymph nodes are seen within the right axilla and supraclavicular regions. Additional retropectoral lymph nodes identified bilaterally. Lungs/Pleura: Innumerable pulmonary nodules/masses and pleural based  nodules/masses throughout both lungs indicating diffuse metastatic disease. Consolidations at each lung base are most likely atelectasis. Musculoskeletal: Diffuse osseous metastases throughout the thoracic spine. Associated pathologic compression fracture deformity noted at the T7 vertebral body level, approximately 20% compressed anteriorly. Additional osseous metastasis identified within the right humeral head without definite associated fracture line. Large irregular mass is seen exophytic to the right breast, measuring at least 13 x 9 x 12 cm (AP by transverse by craniocaudal dimensions), with at least some necrotic areas centrally. CT ABDOMEN PELVIS FINDINGS Hepatobiliary: Liver is significantly enlarged and diffusely infiltrated with metastases. Largest metastasis is within the right liver lobe measuring 9.3 x 5 cm. Gallbladder is unremarkable. Pancreas: Pancreas not well seen, likely obscured by conglomerate lymphadenopathy in the upper abdomen, difficult to delineate. Spleen: Within normal  limits in size and appearance. Adrenals/Urinary Tract: Adrenal glands appear grossly normal. Kidneys are unremarkable without mass, stone or hydronephrosis. Bladder appears normal. Stomach/Bowel: Bowel is normal in caliber. Vascular/Lymphatic: Probable conglomerate lymphadenopathy within the upper abdomen, difficult to delineate from the adjacent liver and pancreas. Reproductive: No mass or other significant abnormality. Other: No free fluid or abscess collections seen. No free intraperitoneal air. Musculoskeletal: Diffuse osseous metastases throughout the pelvis and thoracolumbar spine. Associated mild compression fracture deformities, pathologic fractures, involving the L2 and L4 vertebral bodies. Ill-defined fluid/edema throughout the subcutaneous soft tissues indicating anasarca. IMPRESSION: 1. Massive right breast mass with at least some degree of central necrosis, measuring at least 13 x 9 x 12 cm, compatible with  patient's known right breast cancer. 2. Innumerable pulmonary metastases throughout both lungs. Numerous metastatic lymph nodes within the mediastinum and right axilla. Diffuse hepatic metastases, largest within the right liver lobe measuring 9.3 x 5 cm. Probable conglomerate metastatic lymphadenopathy within the upper mediastinum, difficult to delineate from the liver and pancreas. Diffuse osseous metastases. 3. Pathologic compression fracture deformities within the thoracic and lumbar spine (T7, L2 and L4 vertebral bodies), each approximately 10-20% compressed. 4. Anasarca. These results were called by telephone at the time of interpretation on 05/10/2015 at 7:31 pm to Dr. Tilden Fossa , who verbally acknowledged these results. Electronically Signed   By: Bary Richard M.D.   On: 05/10/2015 19:33   Ct Abdomen Pelvis W Contrast  05/10/2015  CLINICAL DATA:  Breast cancer EXAM: CT CHEST, ABDOMEN, AND PELVIS WITH CONTRAST TECHNIQUE: Multidetector CT imaging of the chest, abdomen and pelvis was performed following the standard protocol during bolus administration of intravenous contrast. CONTRAST:  67mL OMNIPAQUE IOHEXOL 300 MG/ML  SOLN COMPARISON:  Breast MRI dated 11/22/2013. FINDINGS: CT CHEST FINDINGS Mediastinum/Lymph Nodes: Numerous enlarged lymph nodes are seen within the mediastinum, largest of which is in the right lower paratracheal region measuring 3.3 x 1.7 cm. Numerous enlarged lymph nodes are seen within the right axilla and supraclavicular regions. Additional retropectoral lymph nodes identified bilaterally. Lungs/Pleura: Innumerable pulmonary nodules/masses and pleural based nodules/masses throughout both lungs indicating diffuse metastatic disease. Consolidations at each lung base are most likely atelectasis. Musculoskeletal: Diffuse osseous metastases throughout the thoracic spine. Associated pathologic compression fracture deformity noted at the T7 vertebral body level, approximately 20% compressed  anteriorly. Additional osseous metastasis identified within the right humeral head without definite associated fracture line. Large irregular mass is seen exophytic to the right breast, measuring at least 13 x 9 x 12 cm (AP by transverse by craniocaudal dimensions), with at least some necrotic areas centrally. CT ABDOMEN PELVIS FINDINGS Hepatobiliary: Liver is significantly enlarged and diffusely infiltrated with metastases. Largest metastasis is within the right liver lobe measuring 9.3 x 5 cm. Gallbladder is unremarkable. Pancreas: Pancreas not well seen, likely obscured by conglomerate lymphadenopathy in the upper abdomen, difficult to delineate. Spleen: Within normal limits in size and appearance. Adrenals/Urinary Tract: Adrenal glands appear grossly normal. Kidneys are unremarkable without mass, stone or hydronephrosis. Bladder appears normal. Stomach/Bowel: Bowel is normal in caliber. Vascular/Lymphatic: Probable conglomerate lymphadenopathy within the upper abdomen, difficult to delineate from the adjacent liver and pancreas. Reproductive: No mass or other significant abnormality. Other: No free fluid or abscess collections seen. No free intraperitoneal air. Musculoskeletal: Diffuse osseous metastases throughout the pelvis and thoracolumbar spine. Associated mild compression fracture deformities, pathologic fractures, involving the L2 and L4 vertebral bodies. Ill-defined fluid/edema throughout the subcutaneous soft tissues indicating anasarca. IMPRESSION: 1. Massive right breast mass  with at least some degree of central necrosis, measuring at least 13 x 9 x 12 cm, compatible with patient's known right breast cancer. 2. Innumerable pulmonary metastases throughout both lungs. Numerous metastatic lymph nodes within the mediastinum and right axilla. Diffuse hepatic metastases, largest within the right liver lobe measuring 9.3 x 5 cm. Probable conglomerate metastatic lymphadenopathy within the upper mediastinum,  difficult to delineate from the liver and pancreas. Diffuse osseous metastases. 3. Pathologic compression fracture deformities within the thoracic and lumbar spine (T7, L2 and L4 vertebral bodies), each approximately 10-20% compressed. 4. Anasarca. These results were called by telephone at the time of interpretation on 05/10/2015 at 7:31 pm to Dr. Quintella Reichert , who verbally acknowledged these results. Electronically Signed   By: Franki Cabot M.D.   On: 05/10/2015 19:33    EKG: Independently reviewed.  Assessment/Plan Principal Problem:   Metastatic cancer [facility]) Active Problems:   Breast cancer of upper-outer quadrant of right female breast ([place])   1. 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 Paliative care consulted by EDP and will see patient in hospital tomorrow 3. For now will focus on comfort measures only 4. No lab draws, no transfusions (patient Jehovah's witness) 5. Will get wound care consult for breast wound 6. Dilaudid PRN, increase dose as needed.    Code Status: DNR, comfort measures  Family Communication: Family at bedside Disposition Plan: Admit to inpatient   Time spent: 50 min  GARDNER, JARED M. [facility] Pager [phone]  If 7AM-7PM, please contact the day team taking care of the patient [email or web address] Password [facility] 05/10/2015, 8:34 PM

## 2015-05-10 NOTE — ED Notes (Signed)
Per family member-diagnosed with breast cancer 2 years ago-did not seek treatment-abdominal pain, liver, since Monday

## 2015-05-10 NOTE — Progress Notes (Addendum)
[facility] consulted regarding home hospice/palliative care.  Patient listed as not having insurance, pcp Dr. Adrian Blackwater.  Awaiting disposition.

## 2015-05-10 NOTE — Telephone Encounter (Signed)
s.w. pt husband and gv 1st available sooner appt

## 2015-05-10 NOTE — Telephone Encounter (Signed)
Call from sister-in-law Raquel that patient is having pain in her breast and nausea. Requested if she can be seen by the doctor today. Advised that Dr. Lindi Adie is out of the office until Monday. She has not been seen in office since 01/2014. She opted for other treatment outside of the office at that time. Advised to go to urgent care or ED. Also asked if the patient could be seen on Monday. I told her that I would sent a message to the scheduler to see if her appt could be moved up.

## 2015-05-10 NOTE — ED Notes (Signed)
Abnormal lab called to RN and MD

## 2015-05-10 NOTE — ED Notes (Signed)
Patient transported to CT 

## 2015-05-11 DIAGNOSIS — C50919 Malignant neoplasm of unspecified site of unspecified female breast: Secondary | ICD-10-CM

## 2015-05-11 DIAGNOSIS — R1084 Generalized abdominal pain: Secondary | ICD-10-CM

## 2015-05-11 DIAGNOSIS — Z7189 Other specified counseling: Secondary | ICD-10-CM | POA: Insufficient documentation

## 2015-05-11 DIAGNOSIS — D638 Anemia in other chronic diseases classified elsewhere: Secondary | ICD-10-CM

## 2015-05-11 DIAGNOSIS — C799 Secondary malignant neoplasm of unspecified site: Secondary | ICD-10-CM

## 2015-05-11 DIAGNOSIS — Z515 Encounter for palliative care: Secondary | ICD-10-CM | POA: Insufficient documentation

## 2015-05-11 MED ORDER — NON FORMULARY: 500.0000 mg | Freq: Two times a day (BID) | Status: DC

## 2015-05-11 MED ORDER — METRONIDAZOLE IN NACL 5-0.79 MG/ML-% IV SOLN
Freq: Two times a day (BID) | INTRAVENOUS | Status: DC
  Administered 2015-05-11 – 2015-05-12 (×3): via TOPICAL
  Filled 2015-05-11 (×5): qty 100

## 2015-05-11 MED ORDER — CETYLPYRIDINIUM CHLORIDE 0.05 % MT LIQD
7.0000 mL | Freq: Two times a day (BID) | OROMUCOSAL | Status: DC
  Administered 2015-05-11 (×2): 7 mL via OROMUCOSAL

## 2015-05-11 MED ORDER — CHLORHEXIDINE GLUCONATE 0.12 % MT SOLN
15.0000 mL | Freq: Two times a day (BID) | OROMUCOSAL | Status: DC
  Administered 2015-05-11 – 2015-05-12 (×2): 15 mL via OROMUCOSAL
  Filled 2015-05-11 (×2): qty 15

## 2015-05-11 NOTE — Clinical Social Work Note (Signed)
Clinical Social Work Assessment  Patient Details  Name: Jennifer Raymond MRN: [id number] Date of Birth: Jun 21, 1965  Date of referral:  05/11/15               Reason for consult:  Discharge Planning                Permission sought to share information with:  Family Supports Permission granted to share information::  Yes, Verbal Permission Granted  Name::     Jennifer Raymond  Agency::     Relationship::  husband  Contact Information:  [phone]  Housing/Transportation Living arrangements for the past 2 months:  Mobile Home Source of Information:  Patient, Spouse Patient Interpreter Needed:  None Criminal Activity/Legal Involvement Pertinent to Current Situation/Hospitalization:  No - Comment as needed Significant Relationships:  Spouse, Siblings Lives with:  Spouse Do you feel safe going back to the place where you live?  No Need for family participation in patient care:  Yes (Comment) (multiple family members at bedside-pt agreeable to involvement in care)  Care giving concerns:  Pt admitted from home. PMT [place] meeting held and recommendation for residential hospice placement.    Social Worker assessment / plan:    CSW received referral for residential hospice.   CSW met with pt and pt family at bedside-most pt family speaks english, but pt sister-in-law interpreted to ensure understanding. Discussed referral for residential hospice. CSW offered choice. Pt family prefers [employer] [facility]. CSW encouraged pt family to review options to have second option in mind if [facility] is full. Pt family will think about second option. Questions and concerns clarified.   CSW made referral to [facility], [facility]. Referral will be processed and CSW will check in AM about availability. CSW to make secondary referrals to residential hospice if [facility] not available.  CSW to continue to follow for residential hospice placement.   Employment status:  Unemployed Radiation protection practitioner:  Self Pay (Medicaid Pending) PT Recommendations:  Not assessed at this time Information / Referral to community resources:  Other (Comment Required) (Residential Hospice Placement)  Patient/Family's Response to care:  Pt alert and oriented x 4. Pt has multiple family members at bedside providing support. Pt family hopeful for [facility].   Patient/Family's Understanding of and Emotional Response to Diagnosis, Current Treatment, and Prognosis:  Pt family displayed understanding about pt poor prognosis and recommendation for residential hospice.   Emotional Assessment Appearance:  Appears stated age Attitude/Demeanor/Rapport:  Other (appropriate) Affect (typically observed):  Appropriate Orientation:  Oriented to Self, Oriented to Place, Oriented to  Time, Oriented to Situation Alcohol / Substance use:  Not Applicable Psych involvement (Current and /or in the community):  No (Comment)  Discharge Needs  Concerns to be addressed:  Discharge Planning Concerns Readmission within the last 30 days:  No Current discharge risk:  Terminally ill Barriers to Discharge:  Other (referral to residential hospice)   Ladell Pier, LCSW 05/11/2015, 4:02 PM  [phone]

## 2015-05-11 NOTE — Progress Notes (Signed)
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 Sonny Dandy) in November 2015 at which point she refused to undergo chemotherapy or surgery and pursued nonconventional/natural therapy, anemia, developed bilateral arm and leg pains over last 3 months, presented to ED with RUQ abdominal pain and wanting to know if she had any surgical treatment options. Imaging studies revealed widely metastatic disease (pulmonary, mediastinal & axillary lymph nodes, hepatic and skeletal). She was advised that surgery was not an option. She continues to decline any aggressive cancer treatment i.e. chemotherapy or radiation. She declines blood transfusion secondary to religious beliefs/Jehovah's Witness. Hemoglobin in the 4.4 g per DL range. She has abnormal LFTs related to hepatic metastasis. CT head does not show any brain metastasis. She opted for comfort oriented/hospice care. Palliative care has consulted and recommend residential hospice. Patient's pain is controlled on current morphine drip. Wound care consulted for fungating, malodorous right breast mass and their input appreciated. At this time, await residential hospice bed availability for discharge.   DVT prophylaxis: None due to full comfort care. Code Status: DO NOT RESUSCITATE Family Communication: None this morning at bedside Disposition Plan: DC to residential hospice when bed available.   Consultants:  Palliative care team  Procedures:  None  Antimicrobials:  None   Subjective: Denied complaints. Stated that her pain was controlled. Did not want blood transfusion or active cancer treatment. Understands that she is expected to decline  and die soon from her widely metastatic breast cancer.  Objective: Filed Vitals:   05/10/15 2122 05/10/15 2220 05/11/15 0511 05/11/15 1310  BP: 126/57 130/56 120/67 122/52  Pulse: 131 135 116   Temp:  99.6 F (37.6 C) 98.1 F (36.7 C) 97.6 F (36.4 C)  TempSrc:  Oral Oral Oral  Resp: _0 Height:  _1  (1.499 m)    Weight:  53.1 kg (117 lb 1 oz)    SpO2: 90% 84% 95% 93%    Intake/Output Summary (Last 24 hours) at 05/11/15 1417 Last data filed at 05/11/15 0511  Gross per 24 hour  Intake    307 ml  Output      0 ml  Net    307 ml   Filed Weights   05/10/15 2220  Weight: 53.1 kg (117 lb 1 oz)    Exam:  General exam: Small built and cachectic young female lying comfortably in bed. Appears chronically ill-looking. Extreme foul odor on stepping into room. Respiratory system: Clear. No increased work of breathing. Cardiovascular system: S1 & S2 heard, RRR. No JVD, murmurs, gallops, clicks. 1+ pitting bilateral leg edema. Gastrointestinal system: Abdomen is mildly distended, soft and nontender. Normal bowel sounds heard. Central nervous system: Alert and oriented. No focal neurological deficits. Extremities: Symmetric 5 x 5 power.   Data Reviewed: Basic Metabolic Panel:  Recent Labs Lab 05/10/15 1613  NA 136  K 4.3  CL 97*  CO2 19*  GLUCOSE 110*  BUN 23*  CREATININE 0.47  CALCIUM 9.1   Liver Function Tests:  Recent Labs Lab 05/10/15 1613  AST 239*  ALT 83*  ALKPHOS 376*  BILITOT 1.3*  PROT 6.3*  ALBUMIN 2.3*    Recent Labs Lab 05/10/15 1613  LIPASE 18  No results for input(s): AMMONIA in the last 168 hours. CBC:  Recent Labs Lab 05/10/15 1613  WBC 10.8*  HGB 4.4*  HCT 16.0*  MCV 69.9*  PLT 146*   Cardiac Enzymes: No results for input(s): CKTOTAL, CKMB, CKMBINDEX, TROPONINI in the last 168 hours. BNP (last 3 results) No results for input(s): PROBNP in the last 8760 hours. CBG: No results for input(s): GLUCAP in the last 168  hours.  No results found for this or any previous visit (from the past 240 hour(s)).       Studies: Ct Head W Wo Contrast  05/10/2015  CLINICAL DATA:  Breast cancer. EXAM: CT HEAD WITHOUT AND WITH CONTRAST TECHNIQUE: Contiguous axial images were obtained from the base of the skull through the vertex without and with intravenous contrast CONTRAST:  79m OMNIPAQUE IOHEXOL 300 MG/ML  SOLN COMPARISON:  None. FINDINGS: No evidence for acute infarction, hemorrhage, mass lesion, hydrocephalus, or extra-axial fluid. Normal for age cerebral volume. No significant white matter disease. Post infusion, no abnormal enhancement of the brain or visible meninges is seen. There are tiny hypodensities in the calvarium, too small to characterize, without clear destruction of the outer or inner table of the skull, favored to represent venous lakes. No abnormality of the clivus or skull base. Negative orbits. No sinus or mastoid fluid. IMPRESSION: No acute or focal intracranial abnormality. No visible intracranial metastatic disease. Electronically Signed   By: JStaci RighterM.D.   On: 05/10/2015 19:22   Ct Chest W Contrast  05/10/2015  CLINICAL DATA:  Breast cancer EXAM: CT CHEST, ABDOMEN, AND PELVIS WITH CONTRAST TECHNIQUE: Multidetector CT imaging of the chest, abdomen and pelvis was performed following the standard protocol during bolus administration of intravenous contrast. CONTRAST:  831mOMNIPAQUE IOHEXOL 300 MG/ML  SOLN COMPARISON:  Breast MRI dated 11/22/2013. FINDINGS: CT CHEST FINDINGS Mediastinum/Lymph Nodes: Numerous enlarged lymph nodes are seen within the mediastinum, largest of which is in the right lower paratracheal region measuring 3.3 x 1.7 cm. Numerous enlarged lymph nodes are seen within the right axilla and supraclavicular regions. Additional retropectoral lymph nodes identified bilaterally. Lungs/Pleura: Innumerable pulmonary nodules/masses and pleural based nodules/masses throughout both lungs  indicating diffuse metastatic disease. Consolidations at each lung base are most likely atelectasis. Musculoskeletal: Diffuse osseous metastases throughout the thoracic spine. Associated pathologic compression fracture deformity noted at the T7 vertebral body level, approximately 20% compressed anteriorly. Additional osseous metastasis identified within the right humeral head without definite associated fracture line. Large irregular mass is seen exophytic to the right breast, measuring at least 13 x 9 x 12 cm (AP by transverse by craniocaudal dimensions), with at least some necrotic areas centrally. CT ABDOMEN PELVIS FINDINGS Hepatobiliary: Liver is significantly enlarged and diffusely infiltrated with metastases. Largest metastasis is within the right liver lobe measuring 9.3 x 5 cm. Gallbladder is unremarkable. Pancreas: Pancreas not well seen, likely obscured by conglomerate lymphadenopathy in the upper abdomen, difficult to delineate. Spleen: Within normal limits in size and appearance. Adrenals/Urinary Tract: Adrenal glands appear grossly normal. Kidneys are unremarkable without mass, stone or hydronephrosis. Bladder appears normal. Stomach/Bowel: Bowel is normal in caliber. Vascular/Lymphatic: Probable conglomerate lymphadenopathy within the upper abdomen, difficult to delineate from the adjacent liver and pancreas. Reproductive: No mass or other significant abnormality. Other: No free fluid or abscess collections seen. No free intraperitoneal air. Musculoskeletal: Diffuse osseous metastases throughout the pelvis and thoracolumbar spine. Associated mild compression fracture deformities, pathologic fractures, involving the L2 and L4 vertebral bodies. Ill-defined fluid/edema throughout the  subcutaneous soft tissues indicating anasarca. IMPRESSION: 1. Massive right breast mass with at least some degree of central necrosis, measuring at least 13 x 9 x 12 cm, compatible with patient's known right breast cancer. 2.  Innumerable pulmonary metastases throughout both lungs. Numerous metastatic lymph nodes within the mediastinum and right axilla. Diffuse hepatic metastases, largest within the right liver lobe measuring 9.3 x 5 cm. Probable conglomerate metastatic lymphadenopathy within the upper mediastinum, difficult to delineate from the liver and pancreas. Diffuse osseous metastases. 3. Pathologic compression fracture deformities within the thoracic and lumbar spine (T7, L2 and L4 vertebral bodies), each approximately 10-20% compressed. 4. Anasarca. These results were called by telephone at the time of interpretation on 05/10/2015 at 7:31 pm to Dr. Quintella Reichert , who verbally acknowledged these results. Electronically Signed   By: Franki Cabot M.D.   On: 05/10/2015 19:33   Ct Abdomen Pelvis W Contrast  05/10/2015  CLINICAL DATA:  Breast cancer EXAM: CT CHEST, ABDOMEN, AND PELVIS WITH CONTRAST TECHNIQUE: Multidetector CT imaging of the chest, abdomen and pelvis was performed following the standard protocol during bolus administration of intravenous contrast. CONTRAST:  2m OMNIPAQUE IOHEXOL 300 MG/ML  SOLN COMPARISON:  Breast MRI dated 11/22/2013. FINDINGS: CT CHEST FINDINGS Mediastinum/Lymph Nodes: Numerous enlarged lymph nodes are seen within the mediastinum, largest of which is in the right lower paratracheal region measuring 3.3 x 1.7 cm. Numerous enlarged lymph nodes are seen within the right axilla and supraclavicular regions. Additional retropectoral lymph nodes identified bilaterally. Lungs/Pleura: Innumerable pulmonary nodules/masses and pleural based nodules/masses throughout both lungs indicating diffuse metastatic disease. Consolidations at each lung base are most likely atelectasis. Musculoskeletal: Diffuse osseous metastases throughout the thoracic spine. Associated pathologic compression fracture deformity noted at the T7 vertebral body level, approximately 20% compressed anteriorly. Additional osseous  metastasis identified within the right humeral head without definite associated fracture line. Large irregular mass is seen exophytic to the right breast, measuring at least 13 x 9 x 12 cm (AP by transverse by craniocaudal dimensions), with at least some necrotic areas centrally. CT ABDOMEN PELVIS FINDINGS Hepatobiliary: Liver is significantly enlarged and diffusely infiltrated with metastases. Largest metastasis is within the right liver lobe measuring 9.3 x 5 cm. Gallbladder is unremarkable. Pancreas: Pancreas not well seen, likely obscured by conglomerate lymphadenopathy in the upper abdomen, difficult to delineate. Spleen: Within normal limits in size and appearance. Adrenals/Urinary Tract: Adrenal glands appear grossly normal. Kidneys are unremarkable without mass, stone or hydronephrosis. Bladder appears normal. Stomach/Bowel: Bowel is normal in caliber. Vascular/Lymphatic: Probable conglomerate lymphadenopathy within the upper abdomen, difficult to delineate from the adjacent liver and pancreas. Reproductive: No mass or other significant abnormality. Other: No free fluid or abscess collections seen. No free intraperitoneal air. Musculoskeletal: Diffuse osseous metastases throughout the pelvis and thoracolumbar spine. Associated mild compression fracture deformities, pathologic fractures, involving the L2 and L4 vertebral bodies. Ill-defined fluid/edema throughout the subcutaneous soft tissues indicating anasarca. IMPRESSION: 1. Massive right breast mass with at least some degree of central necrosis, measuring at least 13 x 9 x 12 cm, compatible with patient's known right breast cancer. 2. Innumerable pulmonary metastases throughout both lungs. Numerous metastatic lymph nodes within the mediastinum and right axilla. Diffuse hepatic metastases, largest within the right liver lobe measuring 9.3 x 5 cm. Probable conglomerate metastatic lymphadenopathy within the upper mediastinum, difficult to delineate from the  liver and pancreas. Diffuse osseous metastases. 3. Pathologic compression fracture deformities within the thoracic and lumbar spine (T7, L2 and L4 vertebral bodies), each  approximately 10-20% compressed. 4. Anasarca. These results were called by telephone at the time of interpretation on 05/10/2015 at 7:31 pm to Dr. Quintella Reichert , who verbally acknowledged these results. Electronically Signed   By: Franki Cabot M.D.   On: 05/10/2015 19:33        Scheduled Meds: . antiseptic oral rinse  7 mL Mouth Rinse q12n4p  . chlorhexidine  15 mL Mouth Rinse BID  . irrigation builder   Topical BID   Continuous Infusions: . morphine 1 mg/hr (05/10/15 2253)    Principal Problem:   Metastatic cancer ([place]) Active Problems:   Breast cancer of upper-outer quadrant of right female breast ([place])   Encounter for palliative care   Generalized abdominal pain   Goals of care, counseling/discussion    Time spent: 23 minutes    Kentrel Clevenger, MD, FACP, FHM. [facility] Pager [phone]  If 7PM-7AM, please contact night-coverage [email or web address] Password [email or web address] 05/11/2015, 2:17 PM    LOS: 1 day

## 2015-05-11 NOTE — Progress Notes (Signed)
Dsg to R breast done this shift, site with malodorous, raised lesions. Area cleansed with NS, Mepitel silicone applied, covered with kerlix moistened with Flagyl, covered with layers of abdominal pad. Patient c/o pain after the procedure. Morphine 2 mg bolus given. Will continue to monitor.

## 2015-05-11 NOTE — Progress Notes (Signed)
Nutrition Brief Note  Patient screened through the Malnutrition Screening Tool (MST) report.    Chart reviewed.  Patient diagnosed with stage 2A BRCA of the R breast 18 months ago.  Used natural therapies for treatment, declined chemo/radiation/surgery.  Has now developed arm, leg, and abdominal pain.  Breast mass has become a fungating mass with foul smelling exudate.  No surgical options due to her anemia (refuses blood transfusions as she's a Jehovah's witness) and progression of tumor.    Patient now transitioning to comfort care. No nutrition interventions warranted at this time.  Please consult as needed.   Veronda Prude, Dietetic Intern Pager: [phone]

## 2015-05-11 NOTE — Consult Note (Signed)
Consultation Note Date: 05/11/2015   Patient Name: Jennifer Raymond  DOB: 03/30/1965  MRN: [id number]  Age / Sex: 50 y.o., female  PCP: Boykin Nearing, MD Referring Physician: Modena Jansky, MD  Reason for Consultation: Inpatient hospice referral    Clinical Assessment/Narrative:  Jennifer Raymond is a 50 y.o. female with a past medical history significant for invasive ductal carcinoma. Patient saw Dr. Lindi Adie from oncology in November 2015 when she returned for follow-up to finalize a treatment plan. At that time, it is noted that she did not wish to undergo chemotherapy or surgery. It is noted that she was seeking natural/unconventional treatments.  Patient has presented to the hospital with bilateral arm and leg pain, right upper quadrant pain. She has been admitted for pain control and wound care management. Palliative care consult for goals of care. Patient is a Sales promotion account executive Witness, refuses blood transfusions. She is with overall progression of tumor. Patient has been admitted to hospitalist service, placed on continuous morphine infusion.     Patient is a frail older than stated age appearing lady resting in bed. She is. Her friends and family are present at the bedside. With the use of digital interpreter, discussed extensively with the patient at the bedside. Goals are to continue to pursue comfort. Patient is agreeable to hospice consultation. Patient and patient's family asking about transfer to hospice home towards the end of this hospitalization. She states that her pain is not more than a 2-4 out of 10 with current use of morphine IV. She denies any constipation. She has occasional nausea. He denies any shortness of breath.  Will place hospice consult, social worker consult. Consider transfer to hospice home when bed available.  Contacts/Participants in Discussion: Primary Decision Maker:     Relationship to  Patient  Patient's spouse Jennifer Raymond at [phone]  HCPOA: yes     SUMMARY OF RECOMMENDATIONS: DNR DNI Comfort measures only Continue morphine infusion Hospice consult Transfer to inpatient hospice Prognosis < 2 weeks   Code Status/Advance Care Planning: DNR    Code Status Orders        Start     Ordered   05/10/15 2032  Do not attempt resuscitation (DNR)   Continuous    Question Answer Comment  In the event of cardiac or respiratory ARREST Do not call a "code blue"   In the event of cardiac or respiratory ARREST Do not perform Intubation, CPR, defibrillation or ACLS   In the event of cardiac or respiratory ARREST Use medication by any route, position, wound care, and other measures to relive pain and suffering. May use oxygen, suction and manual treatment of airway obstruction as needed for comfort.      05/10/15 2033    Code Status History    Date Active Date Inactive Code Status Order ID Comments User Context   This patient has a current code status but no historical code status.      Other Directives:None  Symptom Management:    continue morphine infusion, titrate for comfort   Palliative Prophylaxis:   Delirium Protocol   Psycho-social/Spiritual:  Support System: Strong Desire for further Chaplaincy support:no Additional Recommendations: hospice home, continue pain medications.   Prognosis: < 2 weeks  Discharge Planning: Hospice facility   Chief Complaint/ Primary Diagnoses: Present on Admission:  . Metastatic cancer ([place]) . Breast cancer of upper-outer quadrant of right female breast ([place])  I have reviewed the medical record, interviewed the patient and family, and examined  the patient. The following aspects are pertinent.  Past Medical History  Diagnosis Date  . Anemia   . Hot flashes   . Malignant neoplasm of breast (female), unspecified site   . Family history of malignant neoplasm of breast    Social History   Social History  .  Marital Status: Married    Spouse Name: N/A  . Number of Children: N/A  . Years of Education: N/A   Social History Main Topics  . Smoking status: Never Smoker   . Smokeless tobacco: Never Used  . Alcohol Use: No  . Drug Use: No  . Sexual Activity: Yes    Birth Control/ Protection: Surgical   Other Topics Concern  . None   Social History Narrative   Family History  Problem Relation Age of Onset  . Diabetes Mother   . Hypertension Mother   . Breast cancer Maternal Grandmother     age dx?; deceased 74  . Leukemia Paternal Aunt     currently 51s  . Colon cancer Neg Hx    Scheduled Meds: . antiseptic oral rinse  7 mL Mouth Rinse q12n4p  . chlorhexidine  15 mL Mouth Rinse BID  . irrigation builder   Topical BID   Continuous Infusions: . morphine 1 mg/hr (05/10/15 2253)   PRN Meds:.morphine Medications Prior to Admission:  Prior to Admission medications   Not on File   Allergies  Allergen Reactions  . Other     NO BLOOD PRODUCTS. PT IS A JEHOVAH WITNESS.    Review of Systems + for weakness, pain in abdomen and chest, mild nausea, - for constipation, dyspnea.   Physical Exam Weak frail lady resting in bed Foul odor, chest wall/ breast wound Lungs clear S1 S2 Abdomen not distended, mild generalized tenderness No edema Wasting, cancer cachexia.   Vital Signs: BP 120/67 mmHg  Pulse 116  Temp(Src) 98.1 F (36.7 C) (Oral)  Resp 18  Ht 4\' 11"  (1.499 m)  Wt 53.1 kg (117 lb 1 oz)  BMI 23.63 kg/m2  SpO2 95%  LMP 12/20/2013 (Exact Date)  SpO2: SpO2: 95 % O2 Device:SpO2: 95 % O2 Flow Rate: .   IO: Intake/output summary:  Intake/Output Summary (Last 24 hours) at 05/11/15 1245 Last data filed at 05/11/15 0511  Gross per 24 hour  Intake    307 ml  Output      0 ml  Net    307 ml    LBM: Last BM Date: 05/10/15 Baseline Weight: Weight: 53.1 kg (117 lb 1 oz) Most recent weight: Weight: 53.1 kg (117 lb 1 oz)      Palliative Assessment/Data:  Flowsheet  Rows        Most Recent Value   Intake Tab    Referral Department  Hospitalist   Unit at Time of Referral  Oncology Unit   Palliative Care Primary Diagnosis  Cancer   Date Notified  05/10/15   Palliative Care Type  New Palliative care   Reason for referral  Pain, Clarify Goals of Care   Date of Admission  05/10/15   Date first seen by Palliative Care  05/11/15   # of days IP prior to Palliative referral  0   Clinical Assessment    Palliative Performance Scale Score  30%   Pain Max last 24 hours  5   Pain Min Last 24 hours  4   Dyspnea Max Last 24 Hours  3   Dyspnea Min Last 24 hours  2  Nausea Max Last 24 Hours  2   Nausea Min Last 24 Hours  1   Psychosocial & Spiritual Assessment    Palliative Care Outcomes    Patient/Family meeting held?  Yes   Who was at the meeting?  patient, sister in law, several friends and family members.    Palliative Care Outcomes  Counseled regarding hospice   Palliative Care follow-up planned  No      Additional Data Reviewed:  CBC:    Component Value Date/Time   WBC 10.8* 05/10/2015 1613   WBC 5.2 01/13/2014 1205   HGB 4.4* 05/10/2015 1613   HGB 11.7 01/13/2014 1205   HCT 16.0* 05/10/2015 1613   HCT 38.8 01/13/2014 1205   PLT 146* 05/10/2015 1613   PLT 175 01/13/2014 1205   MCV 69.9* 05/10/2015 1613   MCV 79.7 01/13/2014 1205   NEUTROABS 3.3 01/13/2014 1205   LYMPHSABS 1.4 01/13/2014 1205   MONOABS 0.4 01/13/2014 1205   EOSABS 0.0 01/13/2014 1205   BASOSABS 0.0 01/13/2014 1205   Comprehensive Metabolic Panel:    Component Value Date/Time   NA 136 05/10/2015 1613   NA 140 01/13/2014 1206   K 4.3 05/10/2015 1613   K 3.5 01/13/2014 1206   CL 97* 05/10/2015 1613   CO2 19* 05/10/2015 1613   CO2 26 01/13/2014 1206   BUN 23* 05/10/2015 1613   BUN 5.8* 01/13/2014 1206   CREATININE 0.47 05/10/2015 1613   CREATININE 0.7 01/13/2014 1206   GLUCOSE 110* 05/10/2015 1613   GLUCOSE 107 01/13/2014 1206   CALCIUM 9.1 05/10/2015 1613    CALCIUM 9.6 01/13/2014 1206   AST 239* 05/10/2015 1613   AST 24 01/13/2014 1206   ALT 83* 05/10/2015 1613   ALT 25 01/13/2014 1206   ALKPHOS 376* 05/10/2015 1613   ALKPHOS 80 01/13/2014 1206   BILITOT 1.3* 05/10/2015 1613   BILITOT 0.44 01/13/2014 1206   PROT 6.3* 05/10/2015 1613   PROT 7.3 01/13/2014 1206   ALBUMIN 2.3* 05/10/2015 1613   ALBUMIN 4.1 01/13/2014 1206     Time In: 11 Time Out: 12 Time Total: 60 Greater than 50%  of this time was spent counseling and coordinating care related to the above assessment and plan.  Signed by: Loistine Chance, MD [id number] Loistine Chance, MD  05/11/2015, 12:45 PM  Please contact Palliative Medicine Team phone at [phone] for questions and concerns.

## 2015-05-11 NOTE — Consult Note (Signed)
WOC wound consult note Reason for Consult:Fungating R breast tumor, Palliative and comfort care only at this time.  Wound type:Cancerous lesion to right breast Pressure Ulcer POA: N/A Measurement: Raised lesions (+25 cm) Fungating, maloldorous Wound bed: Fungating tumor to right breast Drainage (amount, consistency, odor) Moderate creamy.  Foul odor Periwound:Intact Dressing procedure/placement/frequency:Will begin liquid Flagyl for odor management.  Cleanse tumor and right breast with NS and pat gently dry.  Apply Mepitel silicone contact layer to right breast.  Cover with kerlix moistened with liquid Flagyl.  Cover with ABD pad and secure with tape.  Change daily. Will not follow at this time.  Please re-consult if needed.  Domenic Moras RN BSN [place] Pager [phone]

## 2015-05-12 DIAGNOSIS — C50919 Malignant neoplasm of unspecified site of unspecified female breast: Secondary | ICD-10-CM | POA: Insufficient documentation

## 2015-05-12 MED ORDER — ALUM & MAG HYDROXIDE-SIMETH 200-200-20 MG/5ML PO SUSP
30.0000 mL | Freq: Once | ORAL | Status: AC
  Administered 2015-05-12: 30 mL via ORAL
  Filled 2015-05-12: qty 30

## 2015-05-12 MED ORDER — SODIUM CHLORIDE 0.9 % IV SOLN: 1.0000 mg/h | INTRAVENOUS | Status: AC

## 2015-05-12 NOTE — Progress Notes (Signed)
Morphine drip expired. Wasted 210 ml morphine drip(1mg /ml) with Herschell Dimes, 2nd RN.Azzie Glatter [place]

## 2015-05-12 NOTE — Progress Notes (Signed)
Family interpreter is here.  Husband would like patient transferred to [facility] if at all possible.  Contacted Debbie at [facility] who states she only needs a DC summary and out of facility DNR form.  Contacted Dr. Algis Liming who is willing to do discharge summary so patient can go tonight.  Will fax to [facility] as soon as DC summary is available.

## 2015-05-12 NOTE — Discharge Summary (Signed)
Physician Discharge Summary  Jennifer Raymond  RKY:[id number]  DOB: 30-Aug-1965  DOA: 05/10/2015  PCP: Jennifer Ends, MD  Admit date: 05/10/2015 Discharge date: 05/12/2015  Time spent: Greater than 30 minutes  Recommendations for Outpatient Follow-up:  1. Patient being discharged to [facility].  Discharge Diagnoses:  Principal Problem:   Metastatic cancer [facility]) Active Problems:   Breast cancer of upper-outer quadrant of right female breast [facility])   Encounter for palliative care   Generalized abdominal pain   Goals of care, counseling/discussion   Metastatic breast cancer ([place])   Discharge Condition: Stable. Guarded.  Diet recommendation: Regular.  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 Sonny Raymond) in November 2015 at which point she refused to undergo chemotherapy or surgery and pursued nonconventional/natural therapy, anemia, developed bilateral arm and leg pains over last 3 months, presented to ED with RUQ abdominal pain and wanting to know if she had any surgical treatment options. Imaging studies revealed widely metastatic disease (pulmonary, mediastinal & axillary lymph nodes, hepatic and skeletal). She was advised that surgery was not an option. She continues to decline any aggressive cancer treatment i.e. chemotherapy or radiation. She declines blood transfusion secondary to religious beliefs/[employer]. Hemoglobin in the 4.4 g per DL range. She has abnormal LFTs related to hepatic metastasis. CT head does not show any brain metastasis. She opted for comfort oriented/hospice care. Palliative care has consulted and recommend residential hospice. Patient's pain is controlled on current morphine drip. Wound care  consulted for fungating, malodorous right breast mass and their input appreciated. At this time, await residential hospice bed availability for discharge.  Discussed at length with patient via her nephew Ms. Jennifer Raymond as interpreter (patient preferred to have family interpret rather than professional interpreter), updated care, answered questions and advised that patient's immediate female blood relatives i.e. daughter (68 years old) and others should follow up with their respective PCPs regarding breast cancer screening. She verbalized understanding.  DO NOT RESUSCITATE  Consultations:  Palliative Care Team  Procedures:  None   Discharge Exam:  Complaints:  Pain adequately controlled. Denies any other complaints.  Filed Vitals:   05/11/15 1310 05/11/15 2046 05/12/15 0612 05/12/15 1403  BP: 122/52 115/62 128/61 127/60  Pulse:  118 123 133  Temp: 97.6 F (36.4 C) 98.4 F (36.9 C) 97.9 F (36.6 C) 98.2 F (36.8 C)  TempSrc: Oral Oral Oral Oral  Resp: _0 Height:      Weight:      SpO2: 93% 94% 97% 89%    General exam: Small built and cachectic young female lying comfortably in bed. Appears chronically ill-looking. Extreme foul odor on stepping into room. Respiratory system: Clear. No increased work of breathing. Cardiovascular system: S1 & S2 heard, RRR. No JVD, murmurs, gallops, clicks. 1+ pitting bilateral leg edema. Gastrointestinal system: Abdomen is mildly distended, soft and nontender. Normal bowel sounds heard. Central nervous system: Alert and oriented. No focal neurological deficits. Extremities: Symmetric 5 x 5 power.  Discharge Instructions      Discharge Instructions    Activity as tolerated - No restrictions    Complete by:  As directed      Call MD for:  difficulty breathing, headache or visual disturbances  Complete by:  As directed      Call MD for:  extreme fatigue    Complete by:  As directed      Call MD for:  persistant dizziness or  light-headedness    Complete by:  As directed      Call MD for:  persistant nausea and vomiting    Complete by:  As directed      Call MD for:  severe uncontrolled pain    Complete by:  As directed      Call MD for:  temperature >100.4    Complete by:  As directed      Diet general    Complete by:  As directed      Discharge wound care:    Complete by:  As directed   [place] wound consult note Reason for Consult:Fungating R breast tumor, Palliative and comfort care only at this time.  Wound type:Cancerous lesion to right breast Pressure Ulcer POA: N/A Measurement: Raised lesions (+25 cm) Fungating, maloldorous Wound bed: Fungating tumor to right breast Drainage (amount, consistency, odor) Moderate creamy. Foul odor Periwound:Intact Dressing procedure/placement/frequency:Will begin liquid Flagyl for odor management. Cleanse tumor and right breast with NS and pat gently dry. Apply Mepitel silicone contact layer to right breast. Cover with kerlix moistened with liquid Flagyl. Cover with ABD pad and secure with tape. Change daily.            Medication List    TAKE these medications        morphine 250 mg in sodium chloride 0.9 % 240 mL  Inject 1 mg/hr into the vein continuous.         Get Medicines reviewed and adjusted: Please take all your medications with you for your next visit with your Primary MD  Please request your Primary MD to go over all hospital tests and procedure/radiological results at the follow up. Please ask your Primary MD to get all Hospital records sent to his/her office.  If you experience worsening of your admission symptoms, develop shortness of breath, life threatening emergency, suicidal or homicidal thoughts you must seek medical attention immediately by calling 911 or calling your MD immediately if symptoms less severe.  You must read complete instructions/literature along with all the possible adverse reactions/side effects for all the  Medicines you take and that have been prescribed to you. Take any new Medicines after you have completely understood and accept all the possible adverse reactions/side effects.   Do not drive when taking pain medications.   Do not take more than prescribed Pain, Sleep and Anxiety Medications  Special Instructions: If you have smoked or chewed Tobacco in the last 2 yrs please stop smoking, stop any regular Alcohol and or any Recreational drug use.  Wear Seat belts while driving.  Please note  You were cared for by a hospitalist during your hospital stay. Once you are discharged, your primary care physician will handle any further medical issues. Please note that NO REFILLS for any discharge medications will be authorized once you are discharged, as it is imperative that you return to your primary care physician (or establish a relationship with a primary care physician if you do not have one) for your aftercare needs so that they can reassess your need for medications and monitor your lab values.    The results of significant diagnostics from this hospitalization (including imaging, microbiology, ancillary and laboratory) are listed below for reference.    Significant Diagnostic Studies: Ct Head W  Wo Contrast  05/10/2015  CLINICAL DATA:  Breast cancer. EXAM: CT HEAD WITHOUT AND WITH CONTRAST TECHNIQUE: Contiguous axial images were obtained from the base of the skull through the vertex without and with intravenous contrast CONTRAST:  49m OMNIPAQUE IOHEXOL 300 MG/ML  SOLN COMPARISON:  None. FINDINGS: No evidence for acute infarction, hemorrhage, mass lesion, hydrocephalus, or extra-axial fluid. Normal for age cerebral volume. No significant white matter disease. Post infusion, no abnormal enhancement of the brain or visible meninges is seen. There are tiny hypodensities in the calvarium, too small to characterize, without clear destruction of the outer or inner table of the skull, favored to  represent venous lakes. No abnormality of the clivus or skull base. Negative orbits. No sinus or mastoid fluid. IMPRESSION: No acute or focal intracranial abnormality. No visible intracranial metastatic disease. Electronically Signed   By: JStaci RighterM.D.   On: 05/10/2015 19:22   Ct Chest W Contrast  05/10/2015  CLINICAL DATA:  Breast cancer EXAM: CT CHEST, ABDOMEN, AND PELVIS WITH CONTRAST TECHNIQUE: Multidetector CT imaging of the chest, abdomen and pelvis was performed following the standard protocol during bolus administration of intravenous contrast. CONTRAST:  830mOMNIPAQUE IOHEXOL 300 MG/ML  SOLN COMPARISON:  Breast MRI dated 11/22/2013. FINDINGS: CT CHEST FINDINGS Mediastinum/Lymph Nodes: Numerous enlarged lymph nodes are seen within the mediastinum, largest of which is in the right lower paratracheal region measuring 3.3 x 1.7 cm. Numerous enlarged lymph nodes are seen within the right axilla and supraclavicular regions. Additional retropectoral lymph nodes identified bilaterally. Lungs/Pleura: Innumerable pulmonary nodules/masses and pleural based nodules/masses throughout both lungs indicating diffuse metastatic disease. Consolidations at each lung base are most likely atelectasis. Musculoskeletal: Diffuse osseous metastases throughout the thoracic spine. Associated pathologic compression fracture deformity noted at the T7 vertebral body level, approximately 20% compressed anteriorly. Additional osseous metastasis identified within the right humeral head without definite associated fracture line. Large irregular mass is seen exophytic to the right breast, measuring at least 13 x 9 x 12 cm (AP by transverse by craniocaudal dimensions), with at least some necrotic areas centrally. CT ABDOMEN PELVIS FINDINGS Hepatobiliary: Liver is significantly enlarged and diffusely infiltrated with metastases. Largest metastasis is within the right liver lobe measuring 9.3 x 5 cm. Gallbladder is unremarkable.  Pancreas: Pancreas not well seen, likely obscured by conglomerate lymphadenopathy in the upper abdomen, difficult to delineate. Spleen: Within normal limits in size and appearance. Adrenals/Urinary Tract: Adrenal glands appear grossly normal. Kidneys are unremarkable without mass, stone or hydronephrosis. Bladder appears normal. Stomach/Bowel: Bowel is normal in caliber. Vascular/Lymphatic: Probable conglomerate lymphadenopathy within the upper abdomen, difficult to delineate from the adjacent liver and pancreas. Reproductive: No mass or other significant abnormality. Other: No free fluid or abscess collections seen. No free intraperitoneal air. Musculoskeletal: Diffuse osseous metastases throughout the pelvis and thoracolumbar spine. Associated mild compression fracture deformities, pathologic fractures, involving the L2 and L4 vertebral bodies. Ill-defined fluid/edema throughout the subcutaneous soft tissues indicating anasarca. IMPRESSION: 1. Massive right breast mass with at least some degree of central necrosis, measuring at least 13 x 9 x 12 cm, compatible with patient's known right breast cancer. 2. Innumerable pulmonary metastases throughout both lungs. Numerous metastatic lymph nodes within the mediastinum and right axilla. Diffuse hepatic metastases, largest within the right liver lobe measuring 9.3 x 5 cm. Probable conglomerate metastatic lymphadenopathy within the upper mediastinum, difficult to delineate from the liver and pancreas. Diffuse osseous metastases. 3. Pathologic compression fracture deformities within the thoracic and lumbar spine (T7, L2 and  L4 vertebral bodies), each approximately 10-20% compressed. 4. Anasarca. These results were called by telephone at the time of interpretation on 05/10/2015 at 7:31 pm to Dr. Quintella Reichert , who verbally acknowledged these results. Electronically Signed   By: Franki Cabot M.D.   On: 05/10/2015 19:33   Ct Abdomen Pelvis W Contrast  05/10/2015  CLINICAL  DATA:  Breast cancer EXAM: CT CHEST, ABDOMEN, AND PELVIS WITH CONTRAST TECHNIQUE: Multidetector CT imaging of the chest, abdomen and pelvis was performed following the standard protocol during bolus administration of intravenous contrast. CONTRAST:  64m OMNIPAQUE IOHEXOL 300 MG/ML  SOLN COMPARISON:  Breast MRI dated 11/22/2013. FINDINGS: CT CHEST FINDINGS Mediastinum/Lymph Nodes: Numerous enlarged lymph nodes are seen within the mediastinum, largest of which is in the right lower paratracheal region measuring 3.3 x 1.7 cm. Numerous enlarged lymph nodes are seen within the right axilla and supraclavicular regions. Additional retropectoral lymph nodes identified bilaterally. Lungs/Pleura: Innumerable pulmonary nodules/masses and pleural based nodules/masses throughout both lungs indicating diffuse metastatic disease. Consolidations at each lung base are most likely atelectasis. Musculoskeletal: Diffuse osseous metastases throughout the thoracic spine. Associated pathologic compression fracture deformity noted at the T7 vertebral body level, approximately 20% compressed anteriorly. Additional osseous metastasis identified within the right humeral head without definite associated fracture line. Large irregular mass is seen exophytic to the right breast, measuring at least 13 x 9 x 12 cm (AP by transverse by craniocaudal dimensions), with at least some necrotic areas centrally. CT ABDOMEN PELVIS FINDINGS Hepatobiliary: Liver is significantly enlarged and diffusely infiltrated with metastases. Largest metastasis is within the right liver lobe measuring 9.3 x 5 cm. Gallbladder is unremarkable. Pancreas: Pancreas not well seen, likely obscured by conglomerate lymphadenopathy in the upper abdomen, difficult to delineate. Spleen: Within normal limits in size and appearance. Adrenals/Urinary Tract: Adrenal glands appear grossly normal. Kidneys are unremarkable without mass, stone or hydronephrosis. Bladder appears normal.  Stomach/Bowel: Bowel is normal in caliber. Vascular/Lymphatic: Probable conglomerate lymphadenopathy within the upper abdomen, difficult to delineate from the adjacent liver and pancreas. Reproductive: No mass or other significant abnormality. Other: No free fluid or abscess collections seen. No free intraperitoneal air. Musculoskeletal: Diffuse osseous metastases throughout the pelvis and thoracolumbar spine. Associated mild compression fracture deformities, pathologic fractures, involving the L2 and L4 vertebral bodies. Ill-defined fluid/edema throughout the subcutaneous soft tissues indicating anasarca. IMPRESSION: 1. Massive right breast mass with at least some degree of central necrosis, measuring at least 13 x 9 x 12 cm, compatible with patient's known right breast cancer. 2. Innumerable pulmonary metastases throughout both lungs. Numerous metastatic lymph nodes within the mediastinum and right axilla. Diffuse hepatic metastases, largest within the right liver lobe measuring 9.3 x 5 cm. Probable conglomerate metastatic lymphadenopathy within the upper mediastinum, difficult to delineate from the liver and pancreas. Diffuse osseous metastases. 3. Pathologic compression fracture deformities within the thoracic and lumbar spine (T7, L2 and L4 vertebral bodies), each approximately 10-20% compressed. 4. Anasarca. These results were called by telephone at the time of interpretation on 05/10/2015 at 7:31 pm to Dr. EQuintella Reichert, who verbally acknowledged these results. Electronically Signed   By: SFranki CabotM.D.   On: 05/10/2015 19:33    Microbiology: No results found for this or any previous visit (from the past 240 hour(s)).   Labs: Basic Metabolic Panel:  Recent Labs Lab 05/10/15 1613  NA 136  K 4.3  CL 97*  CO2 19*  GLUCOSE 110*  BUN 23*  CREATININE 0.47  CALCIUM 9.1  Liver Function Tests:  Recent Labs Lab 05/10/15 1613  AST 239*  ALT 83*  ALKPHOS 376*  BILITOT 1.3*  PROT 6.3*   ALBUMIN 2.3*    Recent Labs Lab 05/10/15 1613  LIPASE 18   No results for input(s): AMMONIA in the last 168 hours. CBC:  Recent Labs Lab 05/10/15 1613  WBC 10.8*  HGB 4.4*  HCT 16.0*  MCV 69.9*  PLT 146*   Cardiac Enzymes: No results for input(s): CKTOTAL, CKMB, CKMBINDEX, TROPONINI in the last 168 hours. BNP: BNP (last 3 results) No results for input(s): BNP in the last 8760 hours.  ProBNP (last 3 results) No results for input(s): PROBNP in the last 8760 hours.  CBG: No results for input(s): GLUCAP in the last 168 hours.     Signed:  Vernell Leep, MD, FACP, FHM. [facility] Pager [phone]  If 7PM-7AM, please contact night-coverage [email or web address] Password [facility] 05/12/2015, 6:34 PM

## 2015-05-12 NOTE — Progress Notes (Signed)
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 Sonny Dandy) in November 2015 at which point she refused to undergo chemotherapy or surgery and pursued nonconventional/natural therapy, anemia, developed bilateral arm and leg pains over last 3 months, presented to ED with RUQ abdominal pain and wanting to know if she had any surgical treatment options. Imaging studies revealed widely metastatic disease (pulmonary, mediastinal & axillary lymph nodes, hepatic and skeletal). She was advised that surgery was not an option. She continues to decline any aggressive cancer treatment i.e. chemotherapy or radiation. She declines blood transfusion secondary to religious beliefs/Jehovah's [employer]. Hemoglobin in the 4.4 g per DL range. She has abnormal LFTs related to hepatic metastasis. CT head does not show any brain metastasis. She opted for comfort oriented/hospice care. Palliative care has consulted and recommend residential hospice. Patient's pain is controlled on current morphine drip. Wound care consulted for fungating, malodorous right breast mass and their input appreciated. At this time, await residential hospice bed availability for discharge.  No residential hospice bed available for today. Discussed at length with patient via her nephew Ms. Florentina Jenny as interpreter (patient preferred to have family interpret rather than professional interpreter), updated care, answered questions and advised that patient's immediate female blood relatives i.e. daughter (97 years old) and others should follow up with their respective PCPs regarding breast cancer screening. She verbalized understanding.   DVT prophylaxis:  None due to full comfort care. Code Status: DO NOT RESUSCITATE Family Communication: discussed with Niece Florentina Jenny at bedside on 3/11 Disposition Plan: DC to residential hospice when bed available.   Consultants:  Palliative care team  Procedures:  None  Antimicrobials:  None   Subjective: Pain adequately controlled. Denies any other complaints.  Objective: Filed Vitals:   05/11/15 1310 05/11/15 2046 05/12/15 0612 05/12/15 1403  BP: 122/52 115/62 128/61 127/60  Pulse:  118 123 133  Temp: 97.6 F (36.4 C) 98.4 F (36.9 C) 97.9 F (36.6 C) 98.2 F (36.8 C)  TempSrc: Oral Oral Oral Oral  Resp: '17 14 18 18  '$ Height:      Weight:      SpO2: 93% 94% 97% 89%    Intake/Output Summary (Last 24 hours) at 05/12/15 1411 Last data filed at 05/12/15 0630  Gross per 24 hour  Intake    785 ml  Output      0 ml  Net    785 ml   Filed Weights   05/10/15 2220  Weight: 53.1 kg (117 lb 1 oz)    Exam:  General exam: Small built and cachectic young female lying comfortably in bed. Appears chronically ill-looking. Extreme foul odor on stepping into room. Respiratory system: Clear. No increased work of breathing. Cardiovascular system: S1 & S2 heard, RRR. No JVD, murmurs, gallops, clicks. 1+ pitting bilateral leg edema. Gastrointestinal system: Abdomen is mildly distended, soft and nontender. Normal bowel sounds heard. Central nervous system: Alert and oriented. No focal neurological deficits. Extremities: Symmetric 5 x 5 power.   Data Reviewed: Basic Metabolic Panel:  Recent Labs Lab 05/10/15 1613  NA 136  K 4.3  CL 97*  CO2 19*  GLUCOSE 110*  BUN 23*  CREATININE 0.47  CALCIUM 9.1   Liver Function Tests:  Recent Labs Lab 05/10/15 1613  AST 239*  ALT 83*  ALKPHOS 376*  BILITOT 1.3*  PROT 6.3*  ALBUMIN 2.3*    Recent Labs Lab 05/10/15 1613  LIPASE 18   No results for input(s): AMMONIA in the last 168 hours. CBC:  Recent Labs Lab 05/10/15 1613  WBC  10.8*  HGB 4.4*  HCT 16.0*  MCV 69.9*  PLT 146*   Cardiac Enzymes: No results for input(s): CKTOTAL, CKMB, CKMBINDEX, TROPONINI in the last 168 hours. BNP (last 3 results) No results for input(s): PROBNP in the last 8760 hours. CBG: No results for input(s): GLUCAP in the last 168 hours.  No results found for this or any previous visit (from the past 240 hour(s)).       Studies: Ct Head W Wo Contrast  05/10/2015  CLINICAL DATA:  Breast cancer. EXAM: CT HEAD WITHOUT AND WITH CONTRAST TECHNIQUE: Contiguous axial images were obtained from the base of the skull through the vertex without and with intravenous contrast CONTRAST:  65m OMNIPAQUE IOHEXOL 300 MG/ML  SOLN COMPARISON:  None. FINDINGS: No evidence for acute infarction, hemorrhage, mass lesion, hydrocephalus, or extra-axial fluid. Normal for age cerebral volume. No significant white matter disease. Post infusion, no abnormal enhancement of the brain or visible meninges is seen. There are tiny hypodensities in the calvarium, too small to characterize, without clear destruction of the outer or inner table of the skull, favored to represent venous lakes. No abnormality of the clivus or skull base. Negative orbits. No sinus or mastoid fluid. IMPRESSION: No acute or focal intracranial abnormality. No visible intracranial metastatic disease. Electronically Signed   By: JStaci RighterM.D.   On: 05/10/2015 19:22   Ct Chest W Contrast  05/10/2015  CLINICAL DATA:  Breast cancer EXAM: CT CHEST, ABDOMEN, AND PELVIS WITH CONTRAST TECHNIQUE: Multidetector CT imaging of the chest, abdomen and pelvis was performed following the standard protocol during bolus administration of intravenous contrast. CONTRAST:  835mOMNIPAQUE IOHEXOL 300 MG/ML  SOLN COMPARISON:  Breast MRI dated 11/22/2013. FINDINGS: CT CHEST FINDINGS Mediastinum/Lymph Nodes: Numerous enlarged lymph nodes are seen within the mediastinum, largest of which is in the right lower paratracheal  region measuring 3.3 x 1.7 cm. Numerous enlarged lymph nodes are seen within the right axilla and supraclavicular regions. Additional retropectoral lymph nodes identified bilaterally. Lungs/Pleura: Innumerable pulmonary nodules/masses and pleural based nodules/masses throughout both lungs indicating diffuse metastatic disease. Consolidations at each lung base are most likely atelectasis. Musculoskeletal: Diffuse osseous metastases throughout the thoracic spine. Associated pathologic compression fracture deformity noted at the T7 vertebral body level, approximately 20% compressed anteriorly. Additional osseous metastasis identified within the right humeral head without definite associated fracture line. Large irregular mass is seen exophytic to the right breast, measuring at least 13 x 9 x 12 cm (AP by transverse by craniocaudal dimensions), with at least some necrotic areas centrally. CT ABDOMEN PELVIS FINDINGS Hepatobiliary: Liver is significantly enlarged and diffusely infiltrated with metastases. Largest metastasis is within the right liver lobe measuring 9.3 x 5 cm. Gallbladder is unremarkable. Pancreas: Pancreas not well seen, likely obscured by conglomerate lymphadenopathy in the upper abdomen, difficult to delineate. Spleen: Within normal limits in size and appearance. Adrenals/Urinary Tract: Adrenal glands appear grossly normal. Kidneys are unremarkable without mass, stone or hydronephrosis. Bladder appears normal. Stomach/Bowel: Bowel is normal in caliber. Vascular/Lymphatic: Probable conglomerate lymphadenopathy within the upper abdomen, difficult to delineate from the adjacent liver and pancreas. Reproductive: No mass or other significant abnormality. Other: No free fluid  or abscess collections seen. No free intraperitoneal air. Musculoskeletal: Diffuse osseous metastases throughout the pelvis and thoracolumbar spine. Associated mild compression fracture deformities, pathologic fractures, involving the L2  and L4 vertebral bodies. Ill-defined fluid/edema throughout the subcutaneous soft tissues indicating anasarca. IMPRESSION: 1. Massive right breast mass with at least some degree of central necrosis, measuring at least 13 x 9 x 12 cm, compatible with patient's known right breast cancer. 2. Innumerable pulmonary metastases throughout both lungs. Numerous metastatic lymph nodes within the mediastinum and right axilla. Diffuse hepatic metastases, largest within the right liver lobe measuring 9.3 x 5 cm. Probable conglomerate metastatic lymphadenopathy within the upper mediastinum, difficult to delineate from the liver and pancreas. Diffuse osseous metastases. 3. Pathologic compression fracture deformities within the thoracic and lumbar spine (T7, L2 and L4 vertebral bodies), each approximately 10-20% compressed. 4. Anasarca. These results were called by telephone at the time of interpretation on 05/10/2015 at 7:31 pm to Dr. Quintella Reichert , who verbally acknowledged these results. Electronically Signed   By: Franki Cabot M.D.   On: 05/10/2015 19:33   Ct Abdomen Pelvis W Contrast  05/10/2015  CLINICAL DATA:  Breast cancer EXAM: CT CHEST, ABDOMEN, AND PELVIS WITH CONTRAST TECHNIQUE: Multidetector CT imaging of the chest, abdomen and pelvis was performed following the standard protocol during bolus administration of intravenous contrast. CONTRAST:  57m OMNIPAQUE IOHEXOL 300 MG/ML  SOLN COMPARISON:  Breast MRI dated 11/22/2013. FINDINGS: CT CHEST FINDINGS Mediastinum/Lymph Nodes: Numerous enlarged lymph nodes are seen within the mediastinum, largest of which is in the right lower paratracheal region measuring 3.3 x 1.7 cm. Numerous enlarged lymph nodes are seen within the right axilla and supraclavicular regions. Additional retropectoral lymph nodes identified bilaterally. Lungs/Pleura: Innumerable pulmonary nodules/masses and pleural based nodules/masses throughout both lungs indicating diffuse metastatic disease.  Consolidations at each lung base are most likely atelectasis. Musculoskeletal: Diffuse osseous metastases throughout the thoracic spine. Associated pathologic compression fracture deformity noted at the T7 vertebral body level, approximately 20% compressed anteriorly. Additional osseous metastasis identified within the right humeral head without definite associated fracture line. Large irregular mass is seen exophytic to the right breast, measuring at least 13 x 9 x 12 cm (AP by transverse by craniocaudal dimensions), with at least some necrotic areas centrally. CT ABDOMEN PELVIS FINDINGS Hepatobiliary: Liver is significantly enlarged and diffusely infiltrated with metastases. Largest metastasis is within the right liver lobe measuring 9.3 x 5 cm. Gallbladder is unremarkable. Pancreas: Pancreas not well seen, likely obscured by conglomerate lymphadenopathy in the upper abdomen, difficult to delineate. Spleen: Within normal limits in size and appearance. Adrenals/Urinary Tract: Adrenal glands appear grossly normal. Kidneys are unremarkable without mass, stone or hydronephrosis. Bladder appears normal. Stomach/Bowel: Bowel is normal in caliber. Vascular/Lymphatic: Probable conglomerate lymphadenopathy within the upper abdomen, difficult to delineate from the adjacent liver and pancreas. Reproductive: No mass or other significant abnormality. Other: No free fluid or abscess collections seen. No free intraperitoneal air. Musculoskeletal: Diffuse osseous metastases throughout the pelvis and thoracolumbar spine. Associated mild compression fracture deformities, pathologic fractures, involving the L2 and L4 vertebral bodies. Ill-defined fluid/edema throughout the subcutaneous soft tissues indicating anasarca. IMPRESSION: 1. Massive right breast mass with at least some degree of central necrosis, measuring at least 13 x 9 x 12 cm, compatible with patient's known right breast cancer. 2. Innumerable pulmonary metastases  throughout both lungs. Numerous metastatic lymph nodes within the mediastinum and right axilla. Diffuse hepatic metastases, largest within the right liver lobe measuring 9.3 x 5 cm. Probable  conglomerate metastatic lymphadenopathy within the upper mediastinum, difficult to delineate from the liver and pancreas. Diffuse osseous metastases. 3. Pathologic compression fracture deformities within the thoracic and lumbar spine (T7, L2 and L4 vertebral bodies), each approximately 10-20% compressed. 4. Anasarca. These results were called by telephone at the time of interpretation on 05/10/2015 at 7:31 pm to Dr. Quintella Reichert , who verbally acknowledged these results. Electronically Signed   By: Franki Cabot M.D.   On: 05/10/2015 19:33        Scheduled Meds: . antiseptic oral rinse  7 mL Mouth Rinse q12n4p  . chlorhexidine  15 mL Mouth Rinse BID  . irrigation builder   Topical BID   Continuous Infusions: . morphine 1 mg/hr (05/12/15 0313)    Principal Problem:   Metastatic cancer ([place]) Active Problems:   Breast cancer of upper-outer quadrant of right female breast ([place])   Encounter for palliative care   Generalized abdominal pain   Goals of care, counseling/discussion    Time spent: 13 minutes    Rima Blizzard, MD, FACP, FHM. [facility] Pager [phone]  If 7PM-7AM, please contact night-coverage [email or web address] Password [email or web address] 05/12/2015, 2:11 PM    LOS: 2 days

## 2015-05-12 NOTE — Progress Notes (Signed)
Daily Progress Note   Patient Name: Jennifer Raymond       Date: 05/12/2015 DOB: Feb 22, 1966  Age: 50 y.o. MRN#: [id number] Attending Physician: Modena Jansky, MD Primary Care Physician: Minerva Ends, MD Admit Date: 05/10/2015  Reason for Consultation/Follow-up: Inpatient hospice referral  Subjective:  remains on morphine infusion,  Appreciate wound care recommendations Appreciate CSW follow up  Interval Events:  await hospice consultation, appreciate [facility] assistance  Length of Stay: 2 days  Current Medications: Scheduled Meds:  . antiseptic oral rinse  7 mL Mouth Rinse q12n4p  . chlorhexidine  15 mL Mouth Rinse BID  . irrigation builder   Topical BID    Continuous Infusions: . morphine 1 mg/hr (05/12/15 0313)    PRN Meds: morphine  Physical Exam: Physical Exam             Awake pale weak appearing lady s1 s2 Clear Abdomen soft No edema Muscle wasting Cachexia Mal odorous breast wound.   Vital Signs: BP 128/61 mmHg  Pulse 123  Temp(Src) 97.9 F (36.6 C) (Oral)  Resp 18  Ht 4\' 11"  (1.499 m)  Wt 53.1 kg (117 lb 1 oz)  BMI 23.63 kg/m2  SpO2 97%  LMP 12/20/2013 (Exact Date) SpO2: SpO2: 97 % O2 Device: O2 Device: Nasal Cannula O2 Flow Rate: O2 Flow Rate (L/min): 2 L/min  Intake/output summary:  Intake/Output Summary (Last 24 hours) at 05/12/15 0842 Last data filed at 05/12/15 0630  Gross per 24 hour  Intake    785 ml  Output      0 ml  Net    785 ml   LBM: Last BM Date: 05/10/15 Baseline Weight: Weight: 53.1 kg (117 lb 1 oz) Most recent weight: Weight: 53.1 kg (117 lb 1 oz)       Palliative Assessment/Data: Flowsheet Rows        Most Recent Value   Intake Tab    Referral Department  Hospitalist   Unit at Time of Referral  Med/Surg Unit   Palliative Care Primary Diagnosis  Cancer   Date Notified  05/10/15   Palliative Care Type  Return patient Palliative Care   Reason for referral  Pain, Clarify Goals of Care, Counsel Regarding Hospice   Date of Admission  05/10/15   Date first seen by Palliative Care  05/11/15   # of days IP prior to Palliative referral  0   Clinical Assessment    Palliative Performance Scale Score  30%   Pain Max last 24 hours  5   Pain Min Last 24 hours  4   Dyspnea Max Last 24 Hours  3   Dyspnea Min Last 24 hours  2   Nausea Max Last 24 Hours  2   Nausea Min Last 24 Hours  1   Psychosocial & Spiritual Assessment    Palliative Care Outcomes    Patient/Family meeting held?  Yes   Who was at the meeting?  patient, sister in law, several friends and family members.    Palliative Care Outcomes  Counseled regarding hospice   Palliative Care follow-up planned  No      Additional Data Reviewed: CBC    Component Value Date/Time   WBC 10.8* 05/10/2015 1613   WBC 5.2 01/13/2014 1205   RBC 2.26* 05/10/2015 1836   RBC 2.29* 05/10/2015 1613   RBC 4.87 01/13/2014 1205   HGB 4.4* 05/10/2015 1613   HGB 11.7 01/13/2014 1205   HCT 16.0* 05/10/2015 1613   HCT 38.8 01/13/2014 1205   PLT 146* 05/10/2015 1613   PLT 175 01/13/2014 1205   MCV 69.9* 05/10/2015 1613   MCV 79.7 01/13/2014 1205   MCH 19.2* 05/10/2015 1613   MCH 24.1* 01/13/2014 1205   MCHC 27.5* 05/10/2015 1613   MCHC 30.2* 01/13/2014 1205   RDW 25.5* 05/10/2015 1613   RDW 29.4* 01/13/2014 1205   LYMPHSABS 1.4 01/13/2014 1205   MONOABS 0.4 01/13/2014 1205   EOSABS 0.0 01/13/2014 1205   BASOSABS 0.0 01/13/2014 1205    CMP     Component Value Date/Time   NA 136 05/10/2015 1613   NA 140 01/13/2014 1206   K 4.3 05/10/2015 1613   K 3.5 01/13/2014 1206   CL 97* 05/10/2015 1613   CO2 19* 05/10/2015 1613   CO2 26 01/13/2014 1206   GLUCOSE 110* 05/10/2015 1613   GLUCOSE 107 01/13/2014 1206   BUN 23* 05/10/2015 1613   BUN 5.8*  01/13/2014 1206   CREATININE 0.47 05/10/2015 1613   CREATININE 0.7 01/13/2014 1206   CALCIUM 9.1 05/10/2015 1613   CALCIUM 9.6 01/13/2014 1206   PROT 6.3* 05/10/2015 1613   PROT 7.3 01/13/2014 1206   ALBUMIN 2.3* 05/10/2015 1613   ALBUMIN 4.1 01/13/2014 1206   AST 239* 05/10/2015 1613   AST 24 01/13/2014 1206   ALT 83* 05/10/2015 1613   ALT 25 01/13/2014 1206   ALKPHOS 376* 05/10/2015 1613   ALKPHOS 80 01/13/2014 1206   BILITOT 1.3* 05/10/2015 1613   BILITOT 0.44 01/13/2014 1206   GFRNONAA >60 05/10/2015 1613   GFRAA >60 05/10/2015 1613       Problem List:  Patient Active Problem List   Diagnosis Date Noted  . Encounter for palliative care   . Generalized abdominal pain   . Goals of care, counseling/discussion   . Metastatic cancer ([place]) 05/10/2015  . Genetic testing 01/15/2015  . Fibroids, submucosal 12/20/2013  . Heavy menses 12/02/2013  . Family history of malignant neoplasm of breast   . Iron deficiency anemia 11/23/2013  . Breast cancer of upper-outer quadrant of right female breast ([place]) 11/17/2013     Palliative Care Assessment & Plan    1.Code Status:  DNR    Code Status Orders        Start     Ordered   05/10/15  2032  Do not attempt resuscitation (DNR)   Continuous    Question Answer Comment  In the event of cardiac or respiratory ARREST Do not call a "code blue"   In the event of cardiac or respiratory ARREST Do not perform Intubation, CPR, defibrillation or ACLS   In the event of cardiac or respiratory ARREST Use medication by any route, position, wound care, and other measures to relive pain and suffering. May use oxygen, suction and manual treatment of airway obstruction as needed for comfort.      05/10/15 2033    Code Status History    Date Active Date Inactive Code Status Order ID Comments User Context   This patient has a current code status but no historical code status.       2. Goals of Care/Additional Recommendations:      Limitations on Scope of Treatment: Full Comfort Care  Desire for further Chaplaincy support:no  Psycho-social Needs: Education on Hospice  3. Symptom Management:      1. As above   4. Palliative Prophylaxis:   Delirium Protocol  5. Prognosis: < 2 weeks  6. Discharge Planning:  Hospice facility   Care plan was discussed with  Patient and her husband Mr Ardath Sax  Thank you for allowing the Palliative Medicine Team to assist in the care of this patient.   Time In: 730 Time Out: 755 Total Time 25 Prolonged Time Billed  no        [id number] Loistine Chance, MD  05/12/2015, 8:42 AM  Please contact Palliative Medicine Team phone at [phone] for questions and concerns.

## 2015-05-12 NOTE — Progress Notes (Signed)
[facility] arrived to transport pt to [facility]. Pt given 2mg  morphine bolus prior to transport. Family at bedside and aware of transfer plan. Hortencia Conradi RN

## 2015-05-12 NOTE — Progress Notes (Addendum)
CSW continuing to follow.  CSW spoke with [facility] this morning and [facility] does not have bed available today. [facility] asked CSW to follow up in AM to check on bed status for tomorrow.   CSW visited pt room and pt husband not present and pt requested CSW follow up when pt husband arrived. CSW asked pt and pt visitors to notify RN when pt husband arrives in order for RN to notify CSW.   CSW to follow up with pt and pt husband regarding referrals to secondary options for residential hospice as [facility] does not have bed available.  Addendum:   Pt husband arrived to hospital and found CSW in hallway.   CSW discussed with pt husband that [facility] does not have bed available today and will need to send referral to secondary option.   CSW reviewed other option with pt husband and pt husband chooses [facility] as a second option. Pt son states that [facility] would be last resort as [facility] are closer to his home and pt wants pt spouse at facility daily.   CSW contacted [facility] and made referral-referral faxed.   CSW awaiting [facility] to review information and notify CSW if facility can accept.  CSW to continue to follow.  Alison Murray, MSW, LCSW Clinical Social Work Weekend coverage [phone]

## 2015-05-12 NOTE — Progress Notes (Signed)
Wasted 251ml of 1mg /mL morphine solution from IV bag after pt discharged, witnessed by Clinton Quant. Hortencia Conradi RN

## 2015-05-15 ENCOUNTER — Encounter: Payer: Self-pay | Admitting: *Deleted

## 2015-05-15 NOTE — Progress Notes (Signed)
Received notes from [facility], reviewed by Dr. Lindi Adie, sent to scan.

## 2015-05-17 ENCOUNTER — Telehealth: Payer: Self-pay | Admitting: *Deleted

## 2015-05-17 ENCOUNTER — Ambulatory Visit: Payer: Self-pay | Admitting: Nurse Practitioner

## 2015-05-17 NOTE — Telephone Encounter (Signed)
Called pt to inquire about missed appt. Husband answered the phone and told interpreter his wife has passed. Unsure of whether she died in hospital or Hospice home. Message fwd to [facility]

## 2015-05-18 ENCOUNTER — Telehealth: Payer: Self-pay

## 2015-05-18 NOTE — Telephone Encounter (Signed)
Call rcvd from Jennifer Raymond - per her request, I let her know that Dr. Geralyn Flash notes state that he felt her cancer was "very curable", that at her diagnosis she was at Stage II which is considered treatable, that he had recommended surgery after chemo but had not notated what surgery he would have recommended, and that there was an interpreter at the office visit.  Jennifer Raymond expressed appreciation and understanding, and stated "we are just trying to understand"  She hid it for a long time, was in pain and didn't tell anyone, and wouldn't agree to go to the doctor until the 06-13-2022.  She died on the 06/21/22."

## 2015-05-21 ENCOUNTER — Ambulatory Visit: Payer: Self-pay | Admitting: Hematology and Oncology

## 2015-05-22 ENCOUNTER — Ambulatory Visit: Payer: Self-pay | Admitting: Hematology and Oncology

## 2015-06-02 DEATH — deceased

## 2015-08-22 ENCOUNTER — Other Ambulatory Visit: Payer: Self-pay | Admitting: Nurse Practitioner

## 2016-04-19 IMAGING — US US PELVIS COMPLETE
1 series · 13 of 25 positions shown · non-contrast
Comparison: None

CLINICAL DATA: Menorrhagia with irregular cycle.

EXAM:
TRANSABDOMINAL AND TRANSVAGINAL ULTRASOUND OF PELVIS
TECHNIQUE: Both transabdominal and transvaginal ultrasound examinations of the
pelvis were performed. Transabdominal technique was performed for
global imaging of the pelvis including uterus, ovaries, adnexal
regions, and pelvic cul-de-sac. It was necessary to proceed with
endovaginal exam following the transabdominal exam to visualize the
endometrium, ovary and adnexal structures..

[Series 1: us pelvis complete · 0.25mm/px · 13 of 47 slices shown]
[im 1/47]
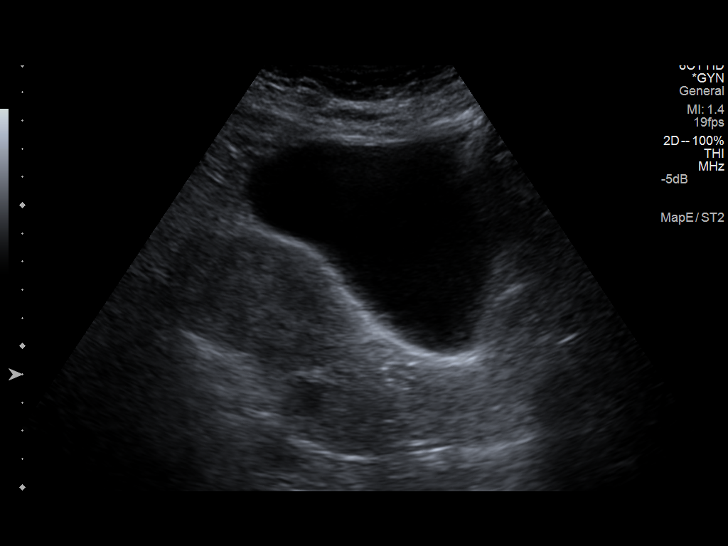
[im 4/47]
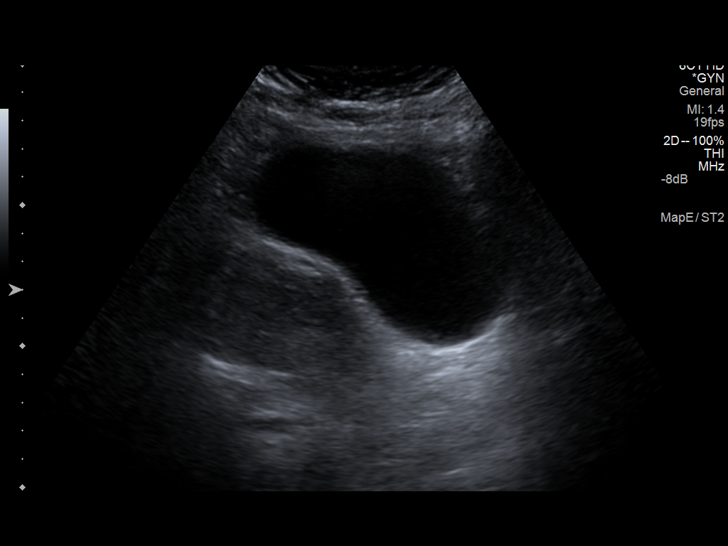
[im 8/47]
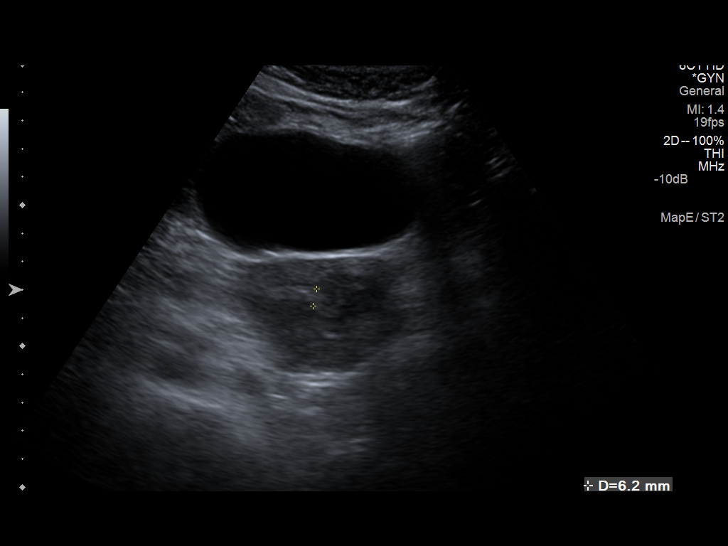
[im 12/47]
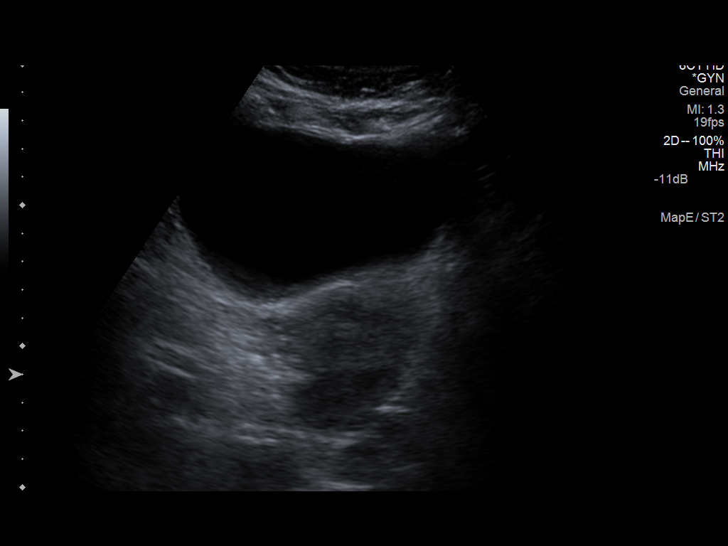
[im 16/47]
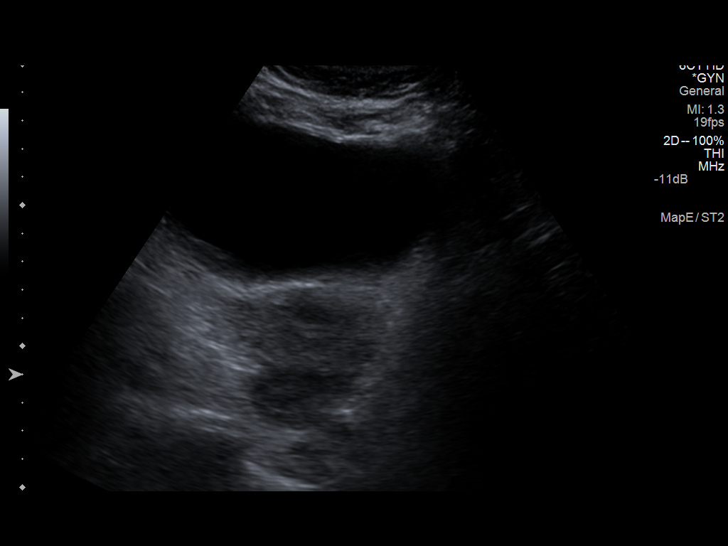
[im 20/47]
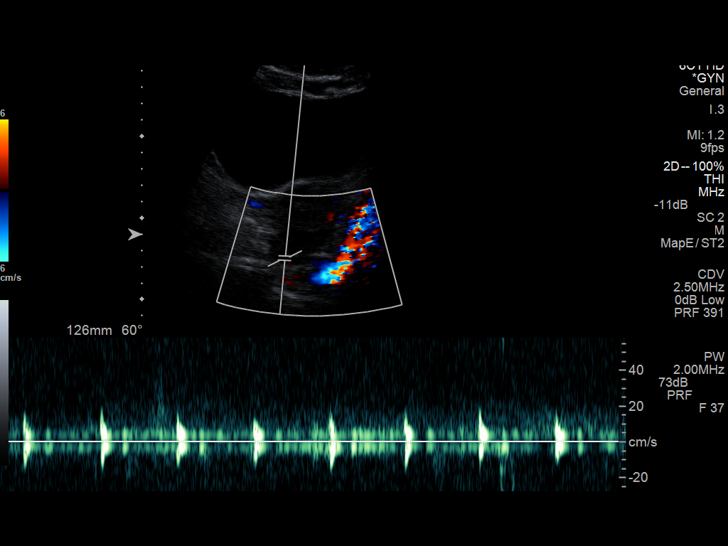
[im 24/47]
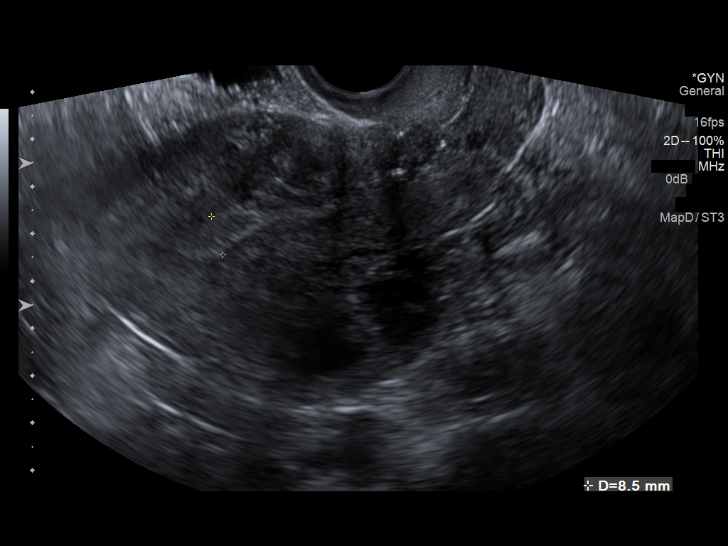
[im 27/47]
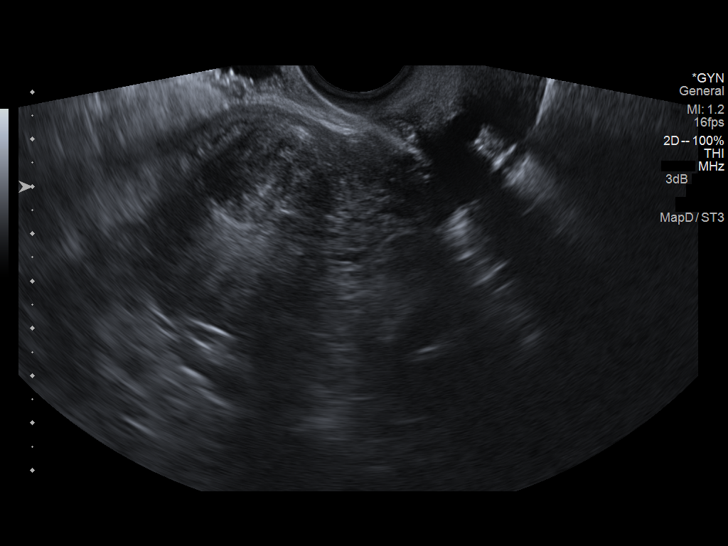
[im 31/47]
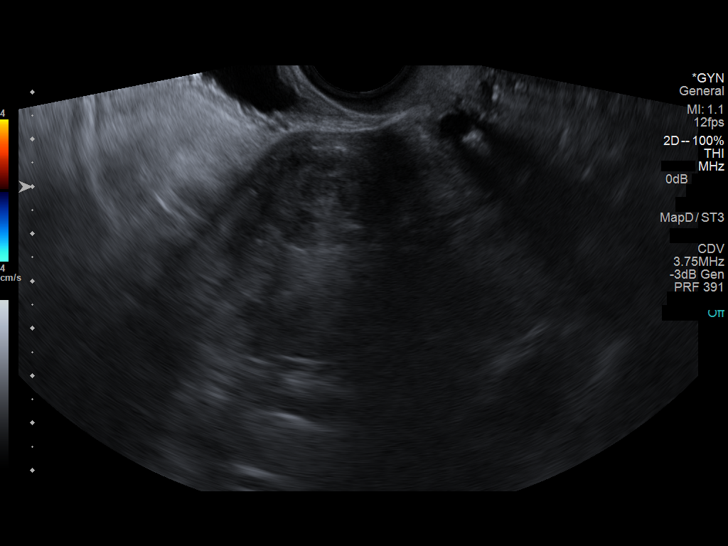
[im 35/47]
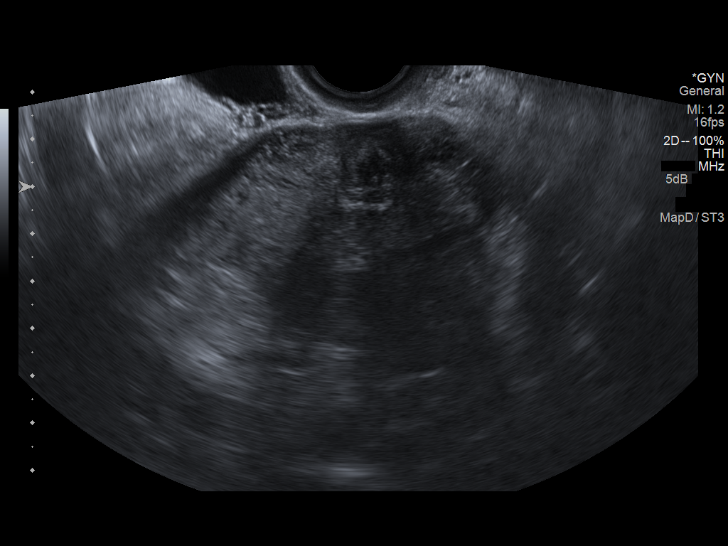
[im 39/47]
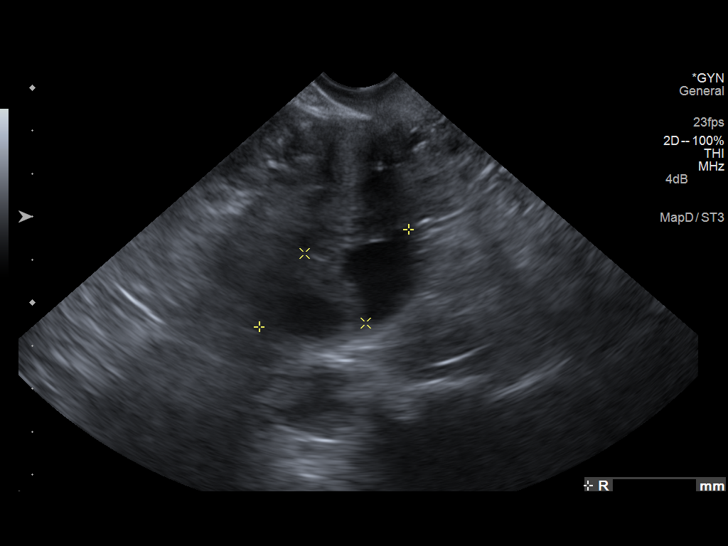
[im 43/47]
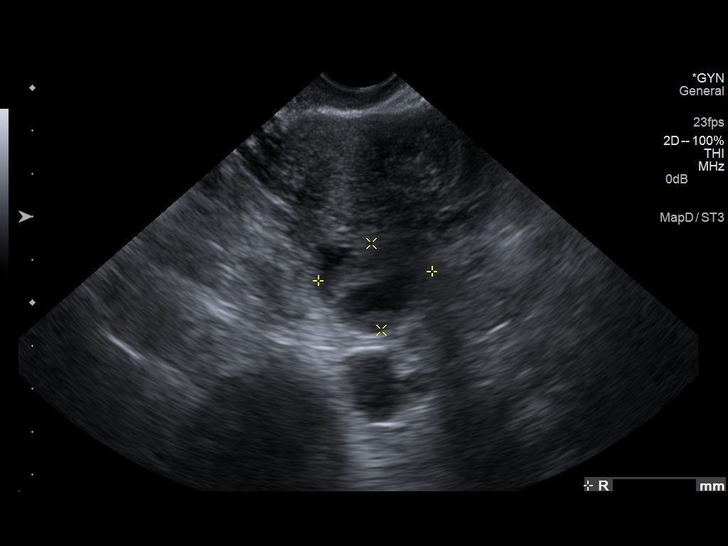
[im 47/47]
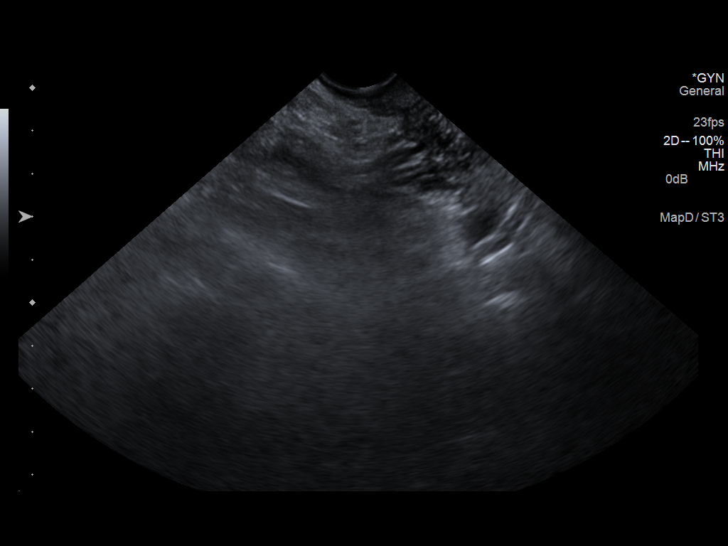

[13 of 25 positions shown; findings below may reference images not displayed]

FINDINGS: Uterus

Measurements: 10.1 x 6.3 x 7.0 cm. There is a partial, sub mucosal
fibroid within the left anterior fundus measuring 2.6 x 2.1 x
cm..

Endometrium

Thickness: 10 mm..  No focal abnormality visualized.

Right ovary

Measurements: 4.1 x 2.2 x 2.7 cm.. Normal appearance/no adnexal
mass.

Left ovary

Measurements: Not visualized.

Other findings

None
IMPRESSION: 1. Left anterior fundal fibroid is partially submucosal and may
explain the patient's menorrhagia. Consider further evaluation with
sonohysterogram for confirmation prior to hysteroscopy. Endometrial
sampling should also be considered if patient is at high risk for
endometrial carcinoma. (Ref: Radiological Reasoning: Algorithmic
Workup of Abnormal Vaginal Bleeding with Endovaginal Sonography and
Sonohysterography. AJR [id number]; [id number])

## 2017-09-11 IMAGING — CT CT HEAD WO/W CM
1 series · 1 of 1 positions shown · IV contrast (omnipaque)
Comparison: None.

CLINICAL DATA: Breast cancer.

EXAM:
CT HEAD WITHOUT AND WITH CONTRAST
TECHNIQUE: Contiguous axial images were obtained from the base of the skull
through the vertex without and with intravenous contrast
CONTRAST:  80mL OMNIPAQUE IOHEXOL 300 MG/ML  SOLN

[Series 1: topogram 0.6 t20f · sagittal · 1.00mm/px · 1 of 1 slices shown]
[im 1/1]
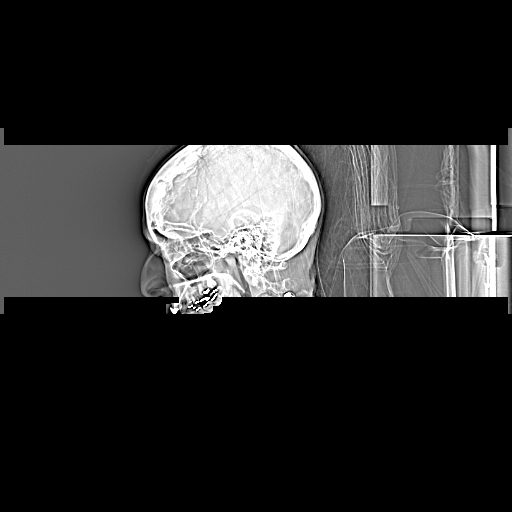

[1 of 1 positions shown; findings below may reference images not displayed]

FINDINGS: No evidence for acute infarction, hemorrhage, mass lesion,
hydrocephalus, or extra-axial fluid. Normal for age cerebral volume.
No significant white matter disease.

Post infusion, no abnormal enhancement of the brain or visible
meninges is seen.

There are tiny hypodensities in the calvarium, too small to
characterize, without clear destruction of the outer or inner table
of the skull, favored to represent venous lakes.

No abnormality of the clivus or skull base. Negative orbits. No
sinus or mastoid fluid.
IMPRESSION: No acute or focal intracranial abnormality. No visible intracranial
metastatic disease.

## 2017-09-11 IMAGING — CT CT CHEST W/ CM
1 series · 14 of 29 positions shown, 18 images · IV contrast (OMNIPAQUE 300)
Comparison: Breast MRI dated 11/22/2013.

CLINICAL DATA: Breast cancer

EXAM:
CT CHEST, ABDOMEN, AND PELVIS WITH CONTRAST
TECHNIQUE: Multidetector CT imaging of the chest, abdomen and pelvis was
performed following the standard protocol during bolus
administration of intravenous contrast.
CONTRAST:  80mL OMNIPAQUE IOHEXOL 300 MG/ML  SOLN

[Series 14: lung windows · axial · 0.72mm/px · z∈[+592,+822]mm · 14 of 50 slices shown, 18 images]
[im 2/50  mediastinal]
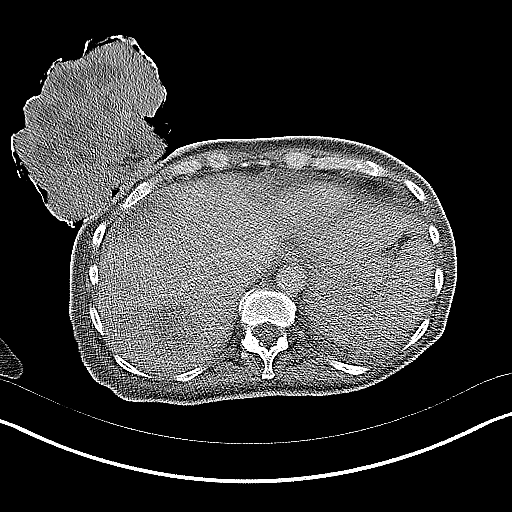
[im 2/50  lung]
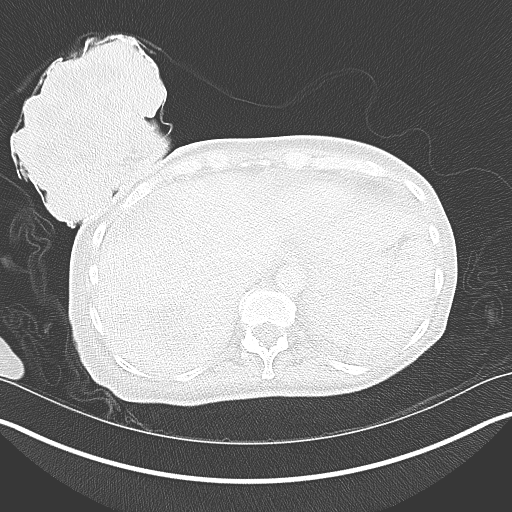
[im 6/50  lung]
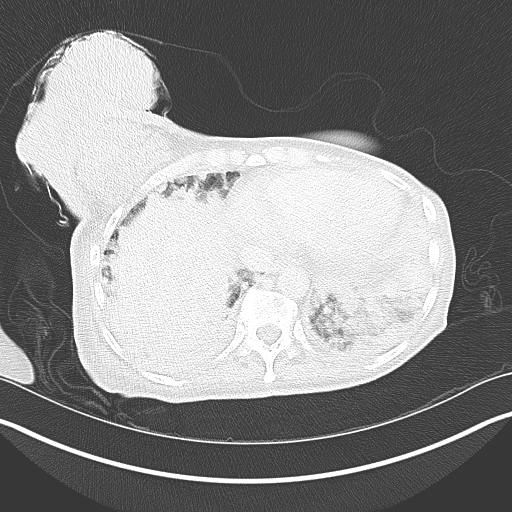
[im 10/50  lung]
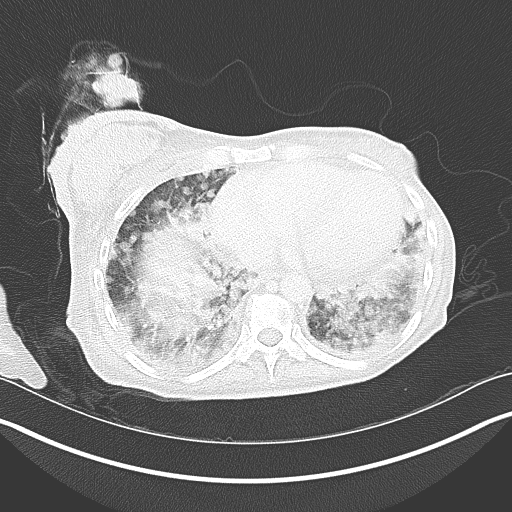
[im 13/50  lung]
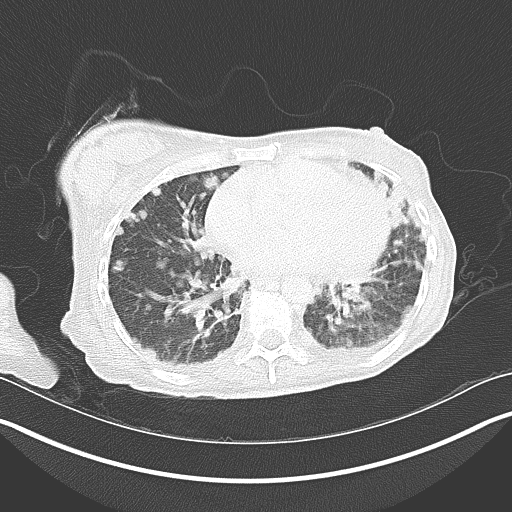
[im 17/50  mediastinal]
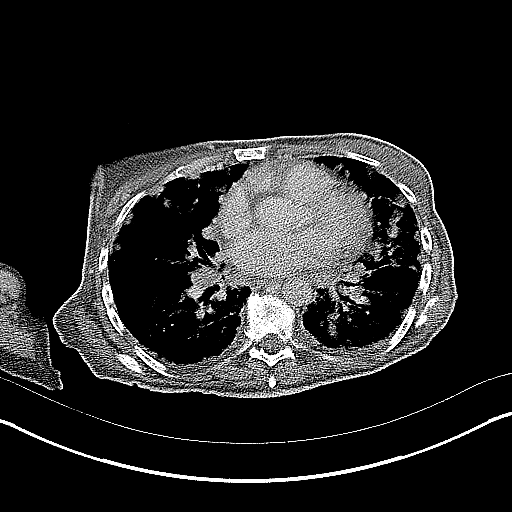
[im 17/50  lung]
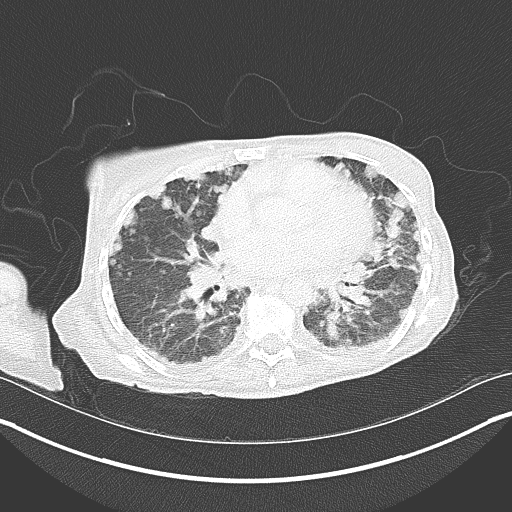
[im 20/50  lung]
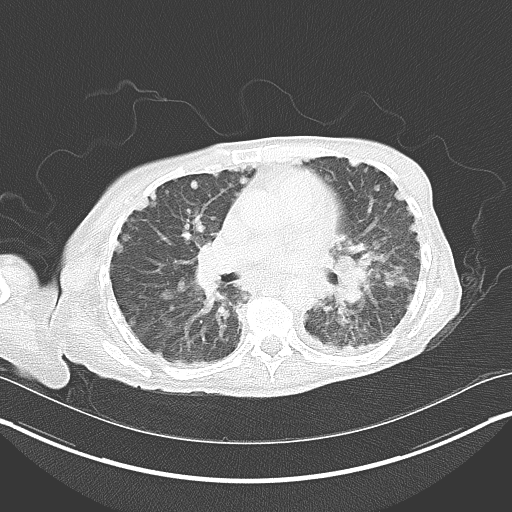
[im 24/50  lung]
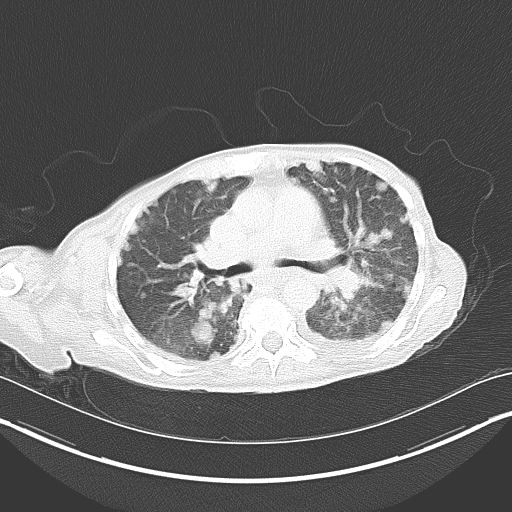
[im 27/50  lung]
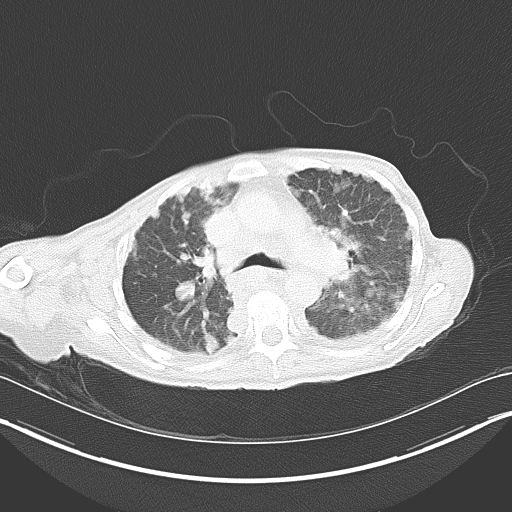
[im 30/50  mediastinal]
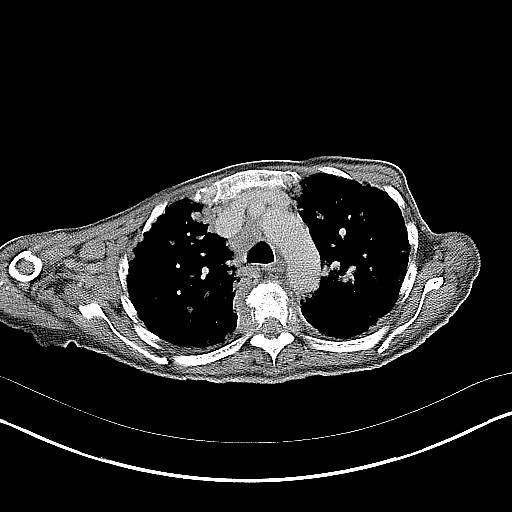
[im 30/50  lung]
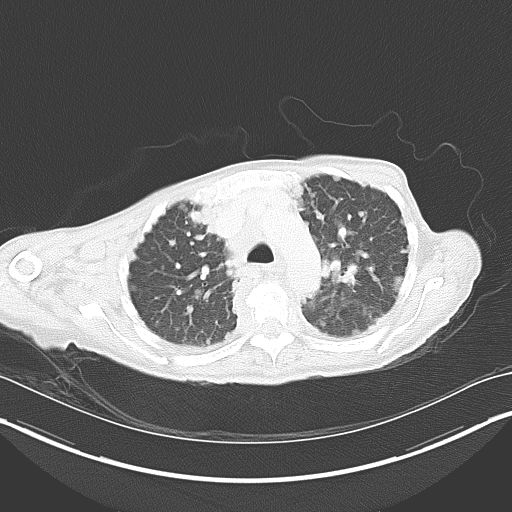
[im 33/50  lung]
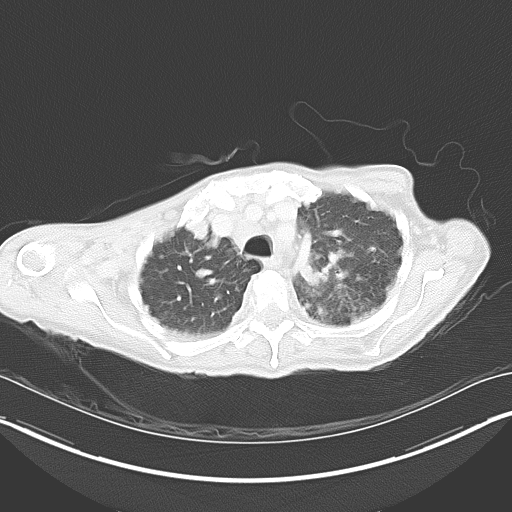
[im 37/50  lung]
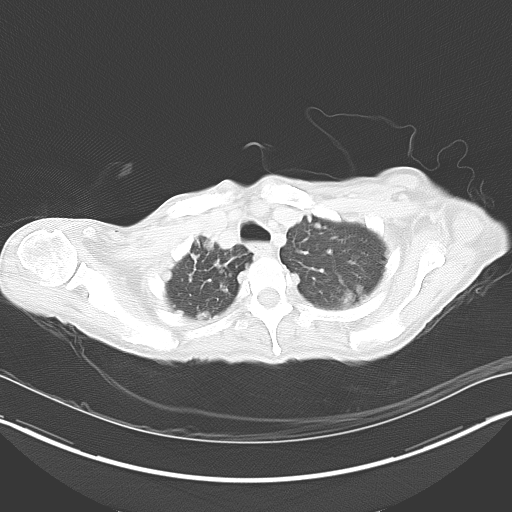
[im 40/50  lung]
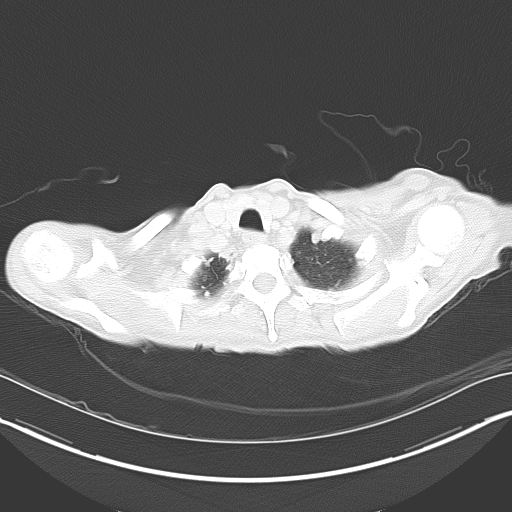
[im 44/50  mediastinal]
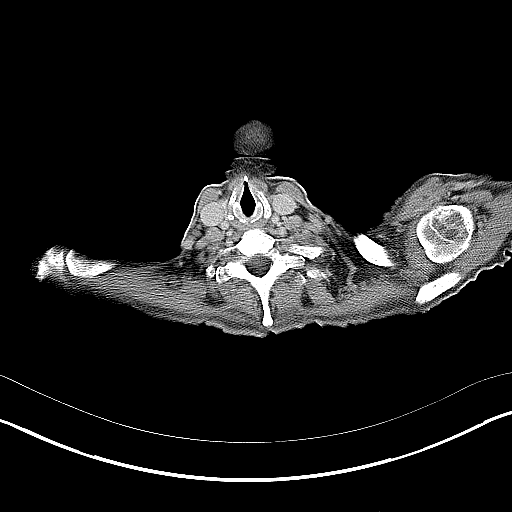
[im 44/50  lung]
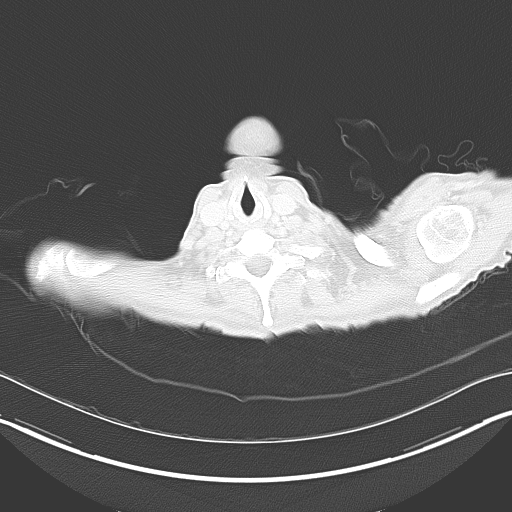
[im 48/50  lung]
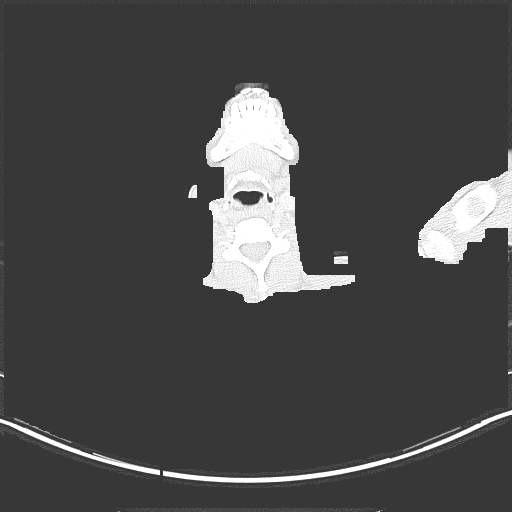

[14 of 29 positions shown; findings below may reference images not displayed]

FINDINGS: CT CHEST FINDINGS

Mediastinum/Lymph Nodes: Numerous enlarged lymph nodes are seen
within the mediastinum, largest of which is in the right lower
paratracheal region measuring 3.3 x 1.7 cm. Numerous enlarged lymph
nodes are seen within the right axilla and supraclavicular regions.
Additional retropectoral lymph nodes identified bilaterally.

Lungs/Pleura: Innumerable pulmonary nodules/masses and pleural based
nodules/masses throughout both lungs indicating diffuse metastatic
disease. Consolidations at each lung base are most likely
atelectasis.

Musculoskeletal: Diffuse osseous metastases throughout the thoracic
spine. Associated pathologic compression fracture deformity noted at
the T7 vertebral body level, approximately 20% compressed
anteriorly. Additional osseous metastasis identified within the
right humeral head without definite associated fracture line.

Large irregular mass is seen exophytic to the right breast,
measuring at least 13 x 9 x 12 cm (AP by transverse by craniocaudal
dimensions), with at least some necrotic areas centrally.

CT ABDOMEN PELVIS FINDINGS

Hepatobiliary: Liver is significantly enlarged and diffusely
infiltrated with metastases. Largest metastasis is within the right
liver lobe measuring 9.3 x 5 cm. Gallbladder is unremarkable.

Pancreas: Pancreas not well seen, likely obscured by conglomerate
lymphadenopathy in the upper abdomen, difficult to delineate.

Spleen: Within normal limits in size and appearance.

Adrenals/Urinary Tract: Adrenal glands appear grossly normal.
Kidneys are unremarkable without mass, stone or hydronephrosis.
Bladder appears normal.

Stomach/Bowel: Bowel is normal in caliber.

Vascular/Lymphatic: Probable conglomerate lymphadenopathy within the
upper abdomen, difficult to delineate from the adjacent liver and
pancreas.

Reproductive: No mass or other significant abnormality.

Other: No free fluid or abscess collections seen. No free
intraperitoneal air.

Musculoskeletal: Diffuse osseous metastases throughout the pelvis
and thoracolumbar spine. Associated mild compression fracture
deformities, pathologic fractures, involving the L2 and L4 vertebral
bodies. Ill-defined fluid/edema throughout the subcutaneous soft
tissues indicating anasarca.
IMPRESSION: 1. Massive right breast mass with at least some degree of central
necrosis, measuring at least 13 x 9 x 12 cm, compatible with
patient's known right breast cancer.
2. Innumerable pulmonary metastases throughout both lungs. Numerous
metastatic lymph nodes within the mediastinum and right axilla.
Diffuse hepatic metastases, largest within the right liver lobe
measuring 9.3 x 5 cm. Probable conglomerate metastatic
lymphadenopathy within the upper mediastinum, difficult to delineate
from the liver and pancreas. Diffuse osseous metastases.
3. Pathologic compression fracture deformities within the thoracic
and lumbar spine (T7, L2 and L4 vertebral bodies), each
approximately 10-20% compressed.
4. Anasarca.
These results were called by telephone at the time of interpretation
on 05/10/2015 at [DATE] to Dr. SEAN BOMBER MARINEL , who verbally
acknowledged these results.

## 2017-09-11 IMAGING — CT CT CHEST W/ CM
1 series · 8 of 32 positions shown, 10 images · IV contrast (OMNIPAQUE 300)
Comparison: Breast MRI dated 11/22/2013.

CLINICAL DATA: Breast cancer

EXAM:
CT CHEST, ABDOMEN, AND PELVIS WITH CONTRAST
TECHNIQUE: Multidetector CT imaging of the chest, abdomen and pelvis was
performed following the standard protocol during bolus
administration of intravenous contrast.
CONTRAST:  80mL OMNIPAQUE IOHEXOL 300 MG/ML  SOLN

[Series 11: coronal · coronal · 0.65mm/px · 8 of 127 slices shown, 10 images]
[im 10/127  mediastinal]
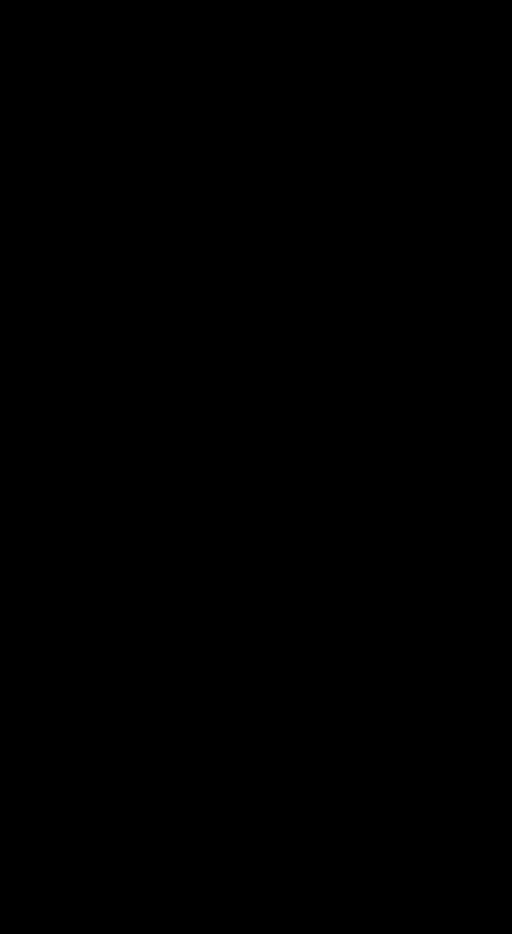
[im 10/127  lung]
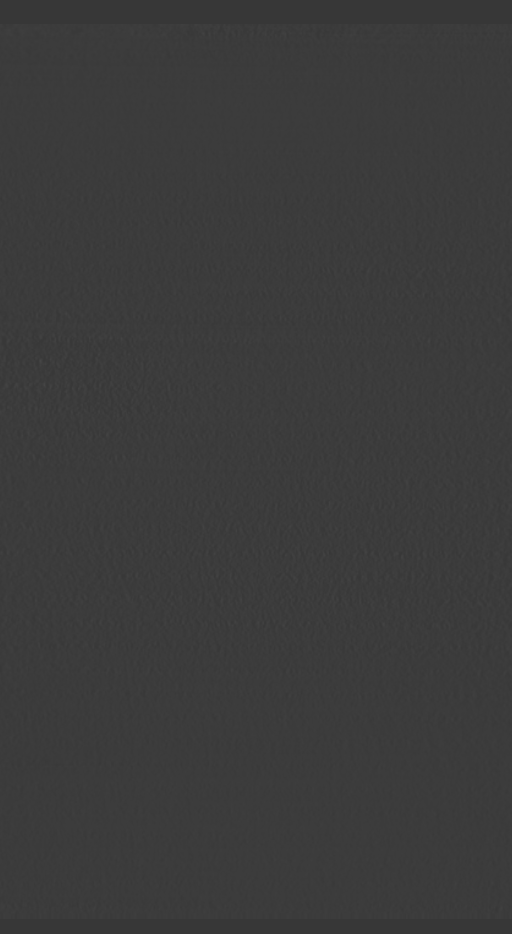
[im 26/127  lung]
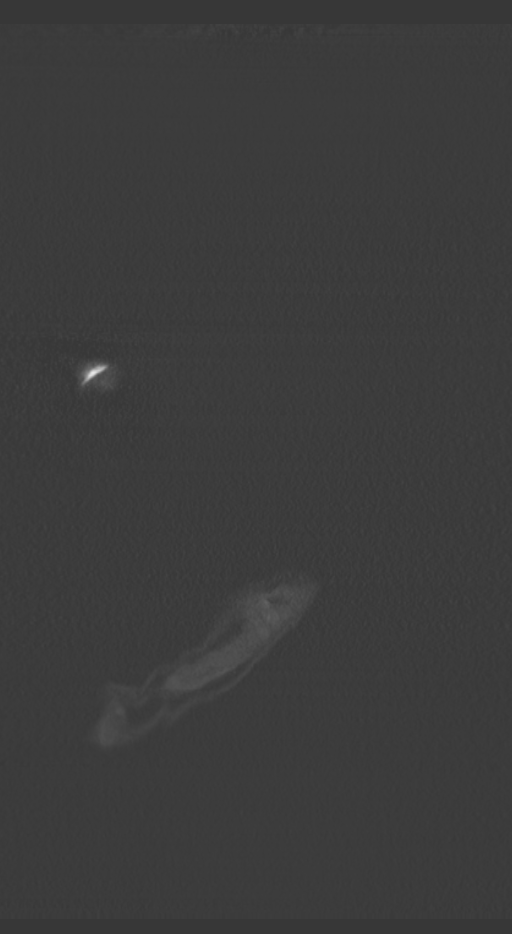
[im 47/127  lung]
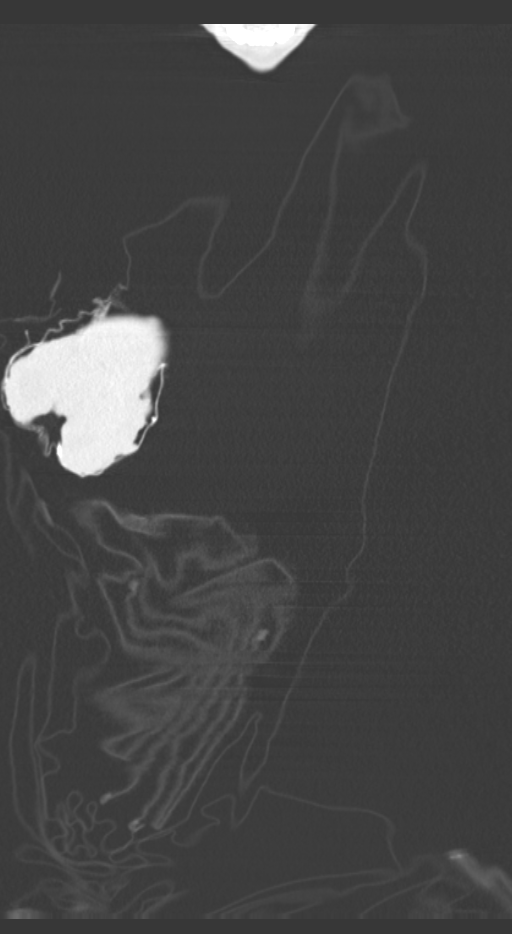
[im 61/127  lung]
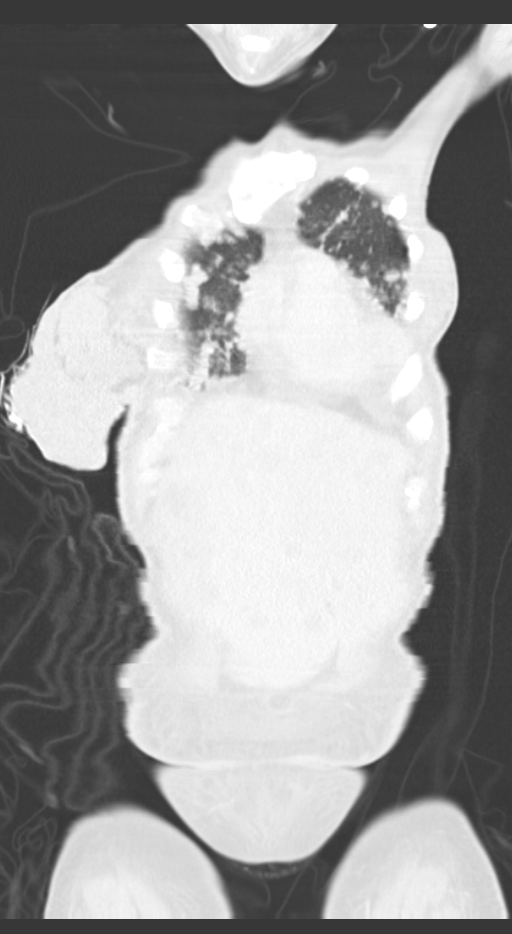
[im 75/127  mediastinal]
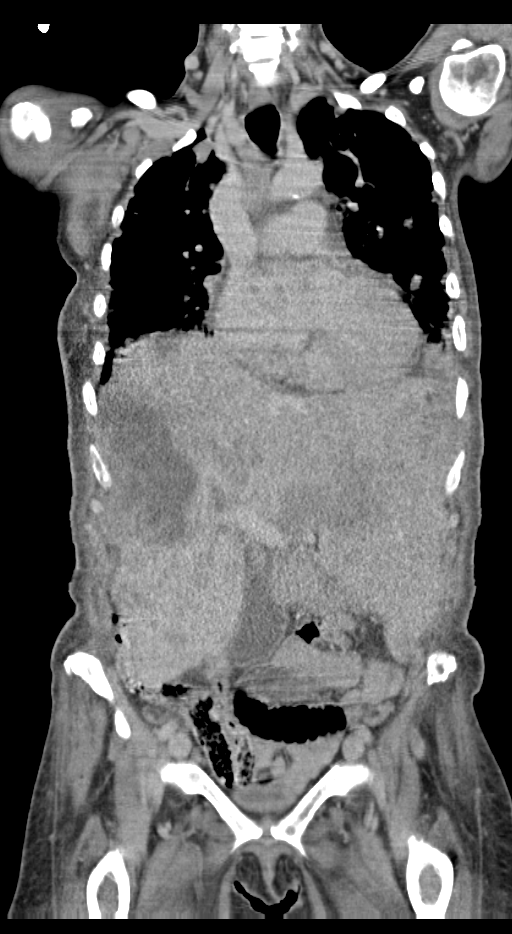
[im 75/127  lung]
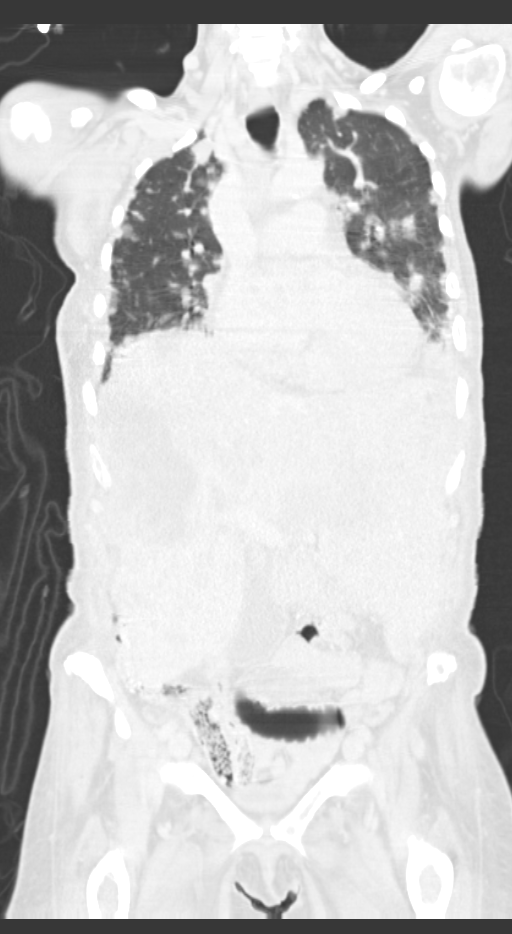
[im 89/127  lung]
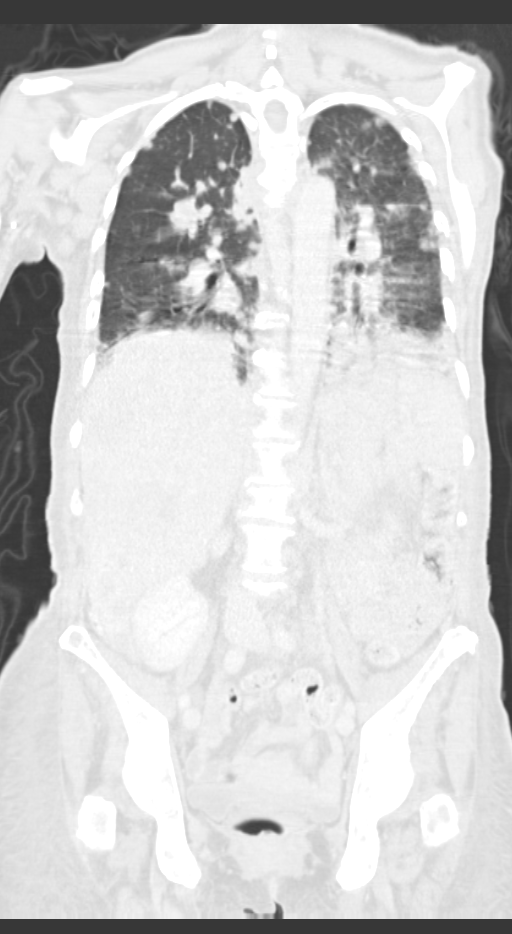
[im 101/127  lung]
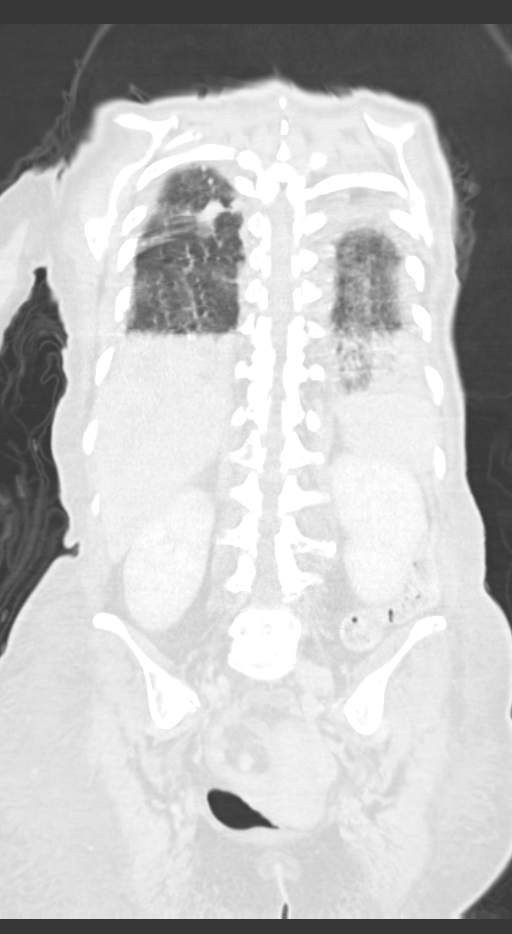
[im 117/127  lung]
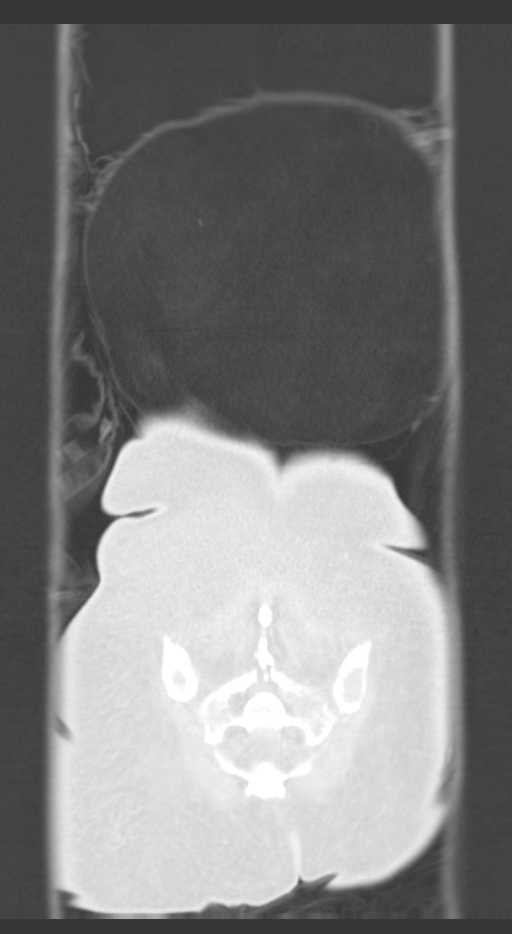

[8 of 32 positions shown; findings below may reference images not displayed]

FINDINGS: CT CHEST FINDINGS

Mediastinum/Lymph Nodes: Numerous enlarged lymph nodes are seen
within the mediastinum, largest of which is in the right lower
paratracheal region measuring 3.3 x 1.7 cm. Numerous enlarged lymph
nodes are seen within the right axilla and supraclavicular regions.
Additional retropectoral lymph nodes identified bilaterally.

Lungs/Pleura: Innumerable pulmonary nodules/masses and pleural based
nodules/masses throughout both lungs indicating diffuse metastatic
disease. Consolidations at each lung base are most likely
atelectasis.

Musculoskeletal: Diffuse osseous metastases throughout the thoracic
spine. Associated pathologic compression fracture deformity noted at
the T7 vertebral body level, approximately 20% compressed
anteriorly. Additional osseous metastasis identified within the
right humeral head without definite associated fracture line.

Large irregular mass is seen exophytic to the right breast,
measuring at least 13 x 9 x 12 cm (AP by transverse by craniocaudal
dimensions), with at least some necrotic areas centrally.

CT ABDOMEN PELVIS FINDINGS

Hepatobiliary: Liver is significantly enlarged and diffusely
infiltrated with metastases. Largest metastasis is within the right
liver lobe measuring 9.3 x 5 cm. Gallbladder is unremarkable.

Pancreas: Pancreas not well seen, likely obscured by conglomerate
lymphadenopathy in the upper abdomen, difficult to delineate.

Spleen: Within normal limits in size and appearance.

Adrenals/Urinary Tract: Adrenal glands appear grossly normal.
Kidneys are unremarkable without mass, stone or hydronephrosis.
Bladder appears normal.

Stomach/Bowel: Bowel is normal in caliber.

Vascular/Lymphatic: Probable conglomerate lymphadenopathy within the
upper abdomen, difficult to delineate from the adjacent liver and
pancreas.

Reproductive: No mass or other significant abnormality.

Other: No free fluid or abscess collections seen. No free
intraperitoneal air.

Musculoskeletal: Diffuse osseous metastases throughout the pelvis
and thoracolumbar spine. Associated mild compression fracture
deformities, pathologic fractures, involving the L2 and L4 vertebral
bodies. Ill-defined fluid/edema throughout the subcutaneous soft
tissues indicating anasarca.
IMPRESSION: 1. Massive right breast mass with at least some degree of central
necrosis, measuring at least 13 x 9 x 12 cm, compatible with
patient's known right breast cancer.
2. Innumerable pulmonary metastases throughout both lungs. Numerous
metastatic lymph nodes within the mediastinum and right axilla.
Diffuse hepatic metastases, largest within the right liver lobe
measuring 9.3 x 5 cm. Probable conglomerate metastatic
lymphadenopathy within the upper mediastinum, difficult to delineate
from the liver and pancreas. Diffuse osseous metastases.
3. Pathologic compression fracture deformities within the thoracic
and lumbar spine (T7, L2 and L4 vertebral bodies), each
approximately 10-20% compressed.
4. Anasarca.
These results were called by telephone at the time of interpretation
on 05/10/2015 at [DATE] to Dr. SEAN BOMBER MARINEL , who verbally
acknowledged these results.
